# Patient Record
Sex: Female | Born: 1955 | Race: White | Hispanic: No | Smoking: Former smoker
Health system: Southern US, Community
[De-identification: ages and names within clinical notes are randomized; demographics above are authoritative.]

## PROBLEM LIST (undated history)

## (undated) DIAGNOSIS — K219 Gastro-esophageal reflux disease without esophagitis: Secondary | ICD-10-CM

## (undated) DIAGNOSIS — Z8719 Personal history of other diseases of the digestive system: Secondary | ICD-10-CM

## (undated) DIAGNOSIS — I10 Essential (primary) hypertension: Secondary | ICD-10-CM

## (undated) DIAGNOSIS — M1990 Unspecified osteoarthritis, unspecified site: Secondary | ICD-10-CM

## (undated) DIAGNOSIS — C541 Malignant neoplasm of endometrium: Secondary | ICD-10-CM

## (undated) DIAGNOSIS — F419 Anxiety disorder, unspecified: Secondary | ICD-10-CM

## (undated) DIAGNOSIS — Z923 Personal history of irradiation: Secondary | ICD-10-CM

## (undated) HISTORY — DX: Malignant neoplasm of endometrium: C54.1

## (undated) HISTORY — PX: JOINT REPLACEMENT: SHX530

## (undated) HISTORY — PX: ESOPHAGOGASTRODUODENOSCOPY ENDOSCOPY: SHX5814

## (undated) HISTORY — PX: CHALAZION EXCISION: SHX213

## (undated) HISTORY — PX: TONSILLECTOMY: SUR1361

## (undated) HISTORY — PX: CHOLECYSTECTOMY: SHX55

## (undated) HISTORY — PX: HYSTERECTOMY ABDOMINAL WITH SALPINGECTOMY: SHX6725

---

## 1999-12-23 ENCOUNTER — Ambulatory Visit (HOSPITAL_COMMUNITY): Admission: RE | Admit: 1999-12-23 | Discharge: 1999-12-23 | Payer: Self-pay | Admitting: Orthopaedic Surgery

## 1999-12-26 ENCOUNTER — Ambulatory Visit (HOSPITAL_COMMUNITY): Admission: RE | Admit: 1999-12-26 | Discharge: 1999-12-26 | Payer: Self-pay | Admitting: Orthopaedic Surgery

## 2001-09-13 ENCOUNTER — Encounter: Payer: Self-pay | Admitting: Internal Medicine

## 2001-09-13 ENCOUNTER — Ambulatory Visit (HOSPITAL_COMMUNITY): Admission: RE | Admit: 2001-09-13 | Discharge: 2001-09-13 | Payer: Self-pay | Admitting: Internal Medicine

## 2011-03-03 ENCOUNTER — Encounter (HOSPITAL_COMMUNITY)
Admission: RE | Admit: 2011-03-03 | Discharge: 2011-03-03 | Disposition: A | Discharge status: Home / Self Care | Payer: 59 | Source: Ambulatory Visit | Attending: Orthopedic Surgery | Admitting: Orthopedic Surgery

## 2011-03-03 LAB — CBC
HCT: 38 % (ref 36.0–46.0)
Hemoglobin: 13 g/dL (ref 12.0–15.0)
MCH: 31 pg (ref 26.0–34.0)
MCHC: 34.2 g/dL (ref 30.0–36.0)
MCV: 90.7 fL (ref 78.0–100.0)
Platelets: 288 10*3/uL (ref 150–400)
RBC: 4.19 MIL/uL (ref 3.87–5.11)
RDW: 12.4 % (ref 11.5–15.5)
WBC: 10.3 10*3/uL (ref 4.0–10.5)

## 2011-03-03 LAB — SURGICAL PCR SCREEN
MRSA, PCR: NEGATIVE
Staphylococcus aureus: POSITIVE — AB

## 2011-03-03 LAB — PROTIME-INR
INR: 0.9 (ref 0.00–1.49)
Prothrombin Time: 12.3 seconds (ref 11.6–15.2)

## 2011-03-03 LAB — DIFFERENTIAL
Basophils Absolute: 0 10*3/uL (ref 0.0–0.1)
Basophils Relative: 0 % (ref 0–1)
Eosinophils Absolute: 0.2 10*3/uL (ref 0.0–0.7)
Eosinophils Relative: 2 % (ref 0–5)
Lymphocytes Relative: 18 % (ref 12–46)
Lymphs Abs: 1.9 10*3/uL (ref 0.7–4.0)
Monocytes Absolute: 0.7 10*3/uL (ref 0.1–1.0)
Monocytes Relative: 7 % (ref 3–12)
Neutro Abs: 7.6 10*3/uL (ref 1.7–7.7)
Neutrophils Relative %: 73 % (ref 43–77)

## 2011-03-03 LAB — BASIC METABOLIC PANEL
BUN: 21 mg/dL (ref 6–23)
CO2: 28 mEq/L (ref 19–32)
Chloride: 102 mEq/L (ref 96–112)
GFR calc non Af Amer: >60 mL/min (ref >60)
Glucose, Bld: 111 mg/dL — ABNORMAL HIGH (ref 70–99)
Potassium: 4.1 mEq/L (ref 3.5–5.1)

## 2011-03-03 LAB — URINALYSIS, ROUTINE W REFLEX MICROSCOPIC
Hgb urine dipstick: NEGATIVE
Ketones, ur: NEGATIVE mg/dL
Protein, ur: NEGATIVE mg/dL
Urobilinogen, UA: 1 mg/dL (ref 0.0–1.0)

## 2011-03-03 LAB — URINE MICROSCOPIC-ADD ON

## 2011-03-03 LAB — APTT: aPTT: 30 seconds (ref 24–37)

## 2011-03-12 ENCOUNTER — Inpatient Hospital Stay (HOSPITAL_COMMUNITY): Payer: 59

## 2011-03-12 ENCOUNTER — Inpatient Hospital Stay (HOSPITAL_COMMUNITY)
Admission: RE | Admit: 2011-03-12 | Discharge: 2011-03-15 | DRG: 470 | Disposition: A | Discharge status: Home Health | Payer: 59 | Source: Ambulatory Visit | Attending: Orthopedic Surgery | Admitting: Orthopedic Surgery

## 2011-03-12 DIAGNOSIS — D62 Acute posthemorrhagic anemia: Secondary | ICD-10-CM | POA: Diagnosis not present

## 2011-03-12 DIAGNOSIS — K219 Gastro-esophageal reflux disease without esophagitis: Secondary | ICD-10-CM | POA: Diagnosis present

## 2011-03-12 DIAGNOSIS — M1610 Unilateral primary osteoarthritis, unspecified hip: Principal | ICD-10-CM | POA: Diagnosis present

## 2011-03-12 DIAGNOSIS — E669 Obesity, unspecified: Secondary | ICD-10-CM | POA: Diagnosis present

## 2011-03-12 DIAGNOSIS — M169 Osteoarthritis of hip, unspecified: Principal | ICD-10-CM | POA: Diagnosis present

## 2011-03-12 DIAGNOSIS — Z8249 Family history of ischemic heart disease and other diseases of the circulatory system: Secondary | ICD-10-CM

## 2011-03-12 DIAGNOSIS — I10 Essential (primary) hypertension: Secondary | ICD-10-CM | POA: Diagnosis present

## 2011-03-12 DIAGNOSIS — Z01812 Encounter for preprocedural laboratory examination: Secondary | ICD-10-CM

## 2011-03-12 DIAGNOSIS — Z7901 Long term (current) use of anticoagulants: Secondary | ICD-10-CM

## 2011-03-12 DIAGNOSIS — E7800 Pure hypercholesterolemia, unspecified: Secondary | ICD-10-CM | POA: Diagnosis present

## 2011-03-12 LAB — CBC
HCT: 25.6 % — ABNORMAL LOW (ref 36.0–46.0)
Hemoglobin: 8.7 g/dL — ABNORMAL LOW (ref 12.0–15.0)
MCH: 30.9 pg (ref 26.0–34.0)
MCV: 90.8 fL (ref 78.0–100.0)
RBC: 2.82 MIL/uL — ABNORMAL LOW (ref 3.87–5.11)
WBC: 10.3 10*3/uL (ref 4.0–10.5)

## 2011-03-12 LAB — TYPE AND SCREEN
ABO/RH(D): O POS
Antibody Screen: NEGATIVE

## 2011-03-12 LAB — ABO/RH: ABO/RH(D): O POS

## 2011-03-12 IMAGING — CR DG PORTABLE PELVIS
1 series · 1 of 1 positions shown · non-contrast
Comparison: None

CLINICAL DATA: Right hip arthroplasty.

PORTABLE PELVIS

[view not recorded]
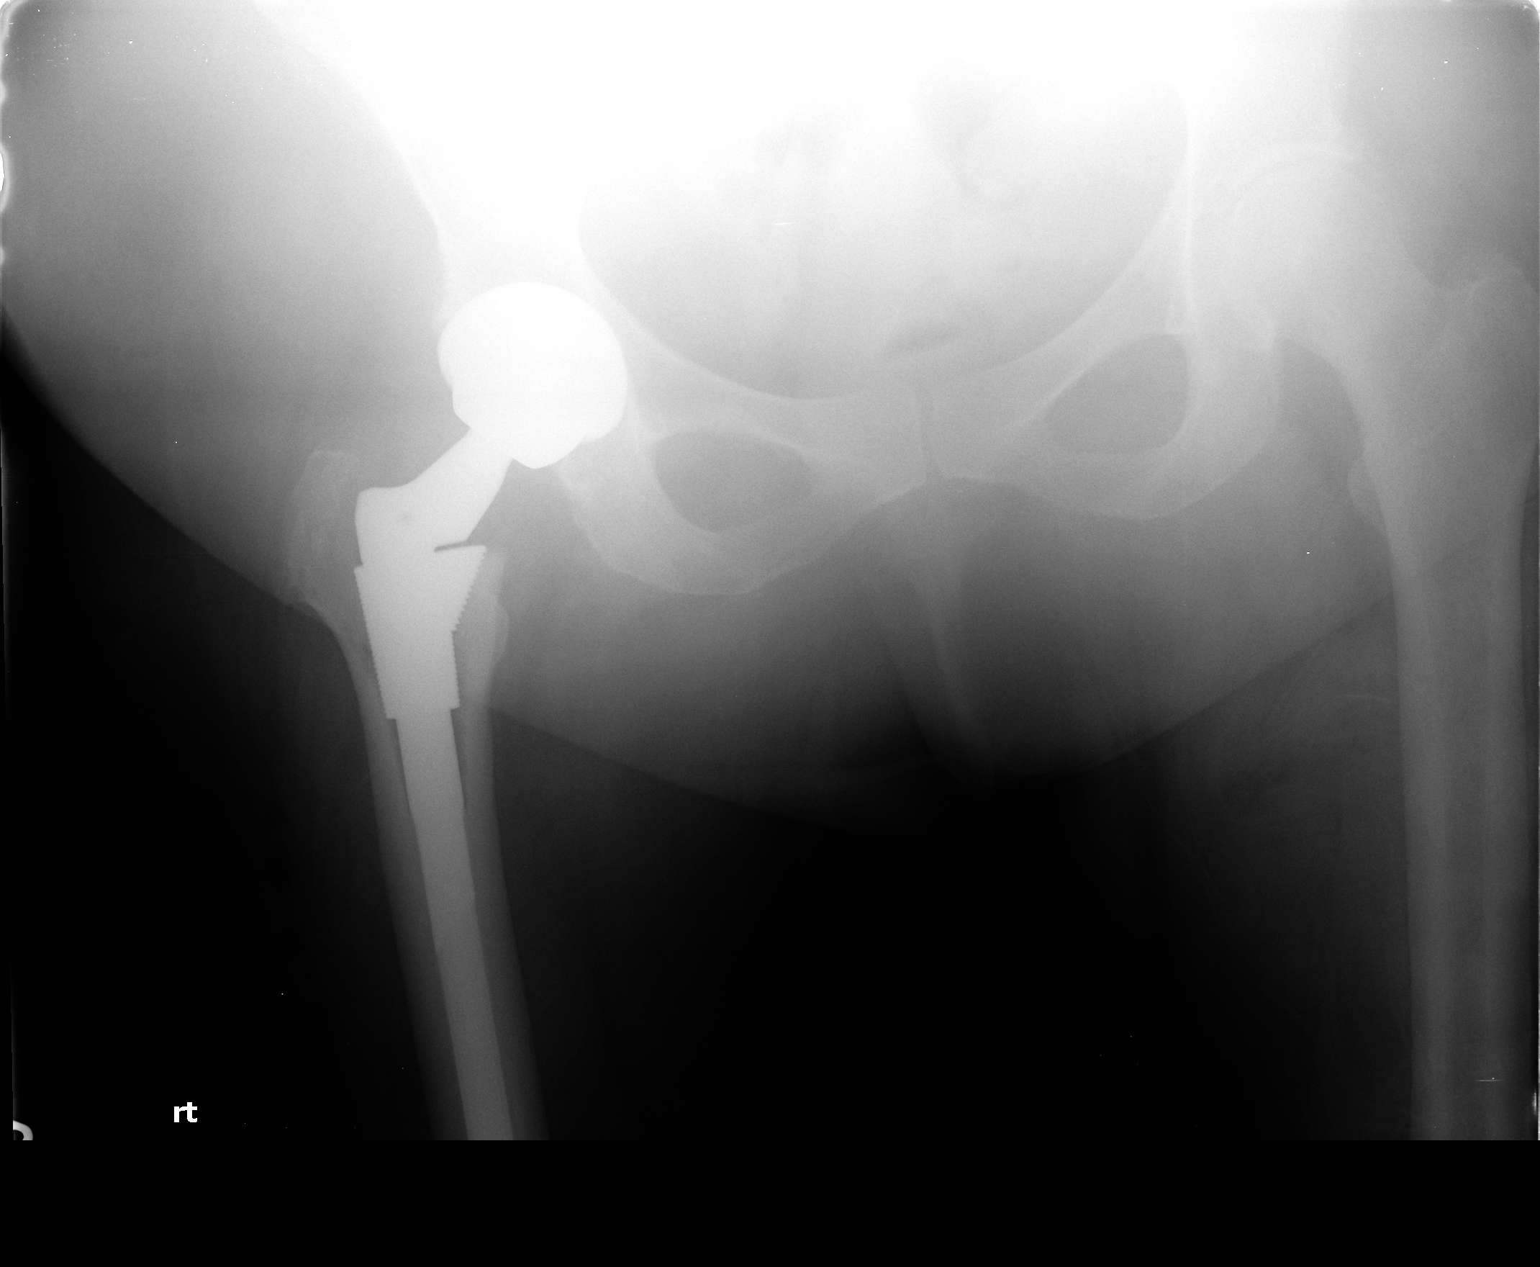

[1 of 1 positions shown; findings below may reference images not displayed]

FINDINGS: Hardware components of the right hip arthroplasty device
are noted. Alignment appears anatomic.  No periprosthetic fracture
or dislocation.
IMPRESSION: 1.  No complications status post right hip arthroplasty.

## 2011-03-13 LAB — CBC
HCT: 24.3 % — ABNORMAL LOW (ref 36.0–46.0)
Hemoglobin: 8.3 g/dL — ABNORMAL LOW (ref 12.0–15.0)
MCHC: 34.2 g/dL (ref 30.0–36.0)
MCV: 90.3 fL (ref 78.0–100.0)
RDW: 12.5 % (ref 11.5–15.5)

## 2011-03-13 LAB — BASIC METABOLIC PANEL
CO2: 32 mEq/L (ref 19–32)
Calcium: 8.5 mg/dL (ref 8.4–10.5)
Chloride: 100 mEq/L (ref 96–112)
Creatinine, Ser: <0.47 mg/dL — ABNORMAL LOW (ref 0.50–1.10)
Glucose, Bld: 140 mg/dL — ABNORMAL HIGH (ref 70–99)

## 2011-03-14 LAB — CBC
HCT: 21.3 % — ABNORMAL LOW (ref 36.0–46.0)
MCH: 30.9 pg (ref 26.0–34.0)
MCV: 90.3 fL (ref 78.0–100.0)
Platelets: 183 10*3/uL (ref 150–400)
RBC: 2.36 MIL/uL — ABNORMAL LOW (ref 3.87–5.11)
RDW: 12.5 % (ref 11.5–15.5)
WBC: 8.8 10*3/uL (ref 4.0–10.5)

## 2011-03-15 LAB — CBC
HCT: 20.1 % — ABNORMAL LOW (ref 36.0–46.0)
MCH: 31.2 pg (ref 26.0–34.0)
MCV: 91 fL (ref 78.0–100.0)
Platelets: 206 10*3/uL (ref 150–400)
RBC: 2.21 MIL/uL — ABNORMAL LOW (ref 3.87–5.11)
WBC: 9.4 10*3/uL (ref 4.0–10.5)

## 2011-03-15 NOTE — Discharge Summary (Signed)
  [name], [name] NO.:  [id number]  MEDICAL RECORD NO.:  [id number]  LOCATION:  5015                         FACILITY:  [facility]  PHYSICIAN:  Feliberto Gottron. Turner Daniels, M.D.   DATE OF BIRTH:  12/29/1955  DATE OF ADMISSION:  03/12/2011 DATE OF DISCHARGE:  03/15/2011                              DISCHARGE SUMMARY   CHIEF COMPLAINTS:  Right hip pain.  HISTORY OF PRESENT ILLNESS:  This is a 55 year old lady who complains of severe unremitting pain in her right hip despite extensive conservative treatment.  She desires a surgical intervention.  All risks and benefits of surgery were discussed with the patient.  PAST MEDICAL HISTORY:  Significant for: 1. High cholesterol. 2. Hypertension. 3. Obesity. 4. Arthritis.  PAST SURGICAL HISTORY:  Significant for cholecystectomy and tonsillectomy.  She is an allergy to [facility].  SOCIAL HISTORY:  She denies use of tobacco and drinks occasional alcohol.  FAMILY HISTORY:  Positive for hypertension and arthritis.  PHYSICAL EXAMINATION:  Gross examination of the right hip demonstrates significant pain with internal rotation.  She has a negative foot tap and is neurovascularly intact.  X-rays of the right hip demonstrate bone-on-bone degenerative joint disease with lateral subluxation of the femoral head.  PREOP LABORATORY DATA:  White blood cells 10.3, red blood cells 4.19, hemoglobin 13, hematocrit 38, and platelets 188.  PT 12.3, INR 0.9, PTT 30.  Sodium 143, potassium 4.1, chloride 102, glucose 111, BUN 21, and creatinine 0.77.  Urinalysis was within normal limits.  HOSPITAL COURSE:  Mr. Dake was admitted to [facility] on March 12, 2011, when she underwent a right total hip arthroplasty.  The procedure was performed by Dr. Gean Birchwood and the patient tolerated it well.  A perioperative Foley catheter was placed.  She was transferred to the floor on Lovenox and Coumadin for DVT prophylaxis.  On the  first postoperative day, she reported moderate pain in her hip.  She had nausea, but no vomiting.  Her dressing was clean.  Foley catheter was removed after physical therapy.  She was able to ambulate 50 feet on postoperative days #2.  Hemoglobin was 7.3, but she denied any dizziness, shortness of breath, or chest pain.  Her dressing remained clean and she was progressing well with physical therapy.  On postoperative day #3, her hemoglobin was 6.9, but she continued to deny any symptoms of anemia.  She passed physical therapy and was discharged home.  DISPOSITION:  The patient was discharged home on March 15, 2011. She was weightbearing as tolerated and would return to the clinic in 2 weeks for x-rays and staple removal.  Home Health Care would manage her wound, Coumadin, and physical therapy.  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     Shirl Harris, PA   ______________________________ Feliberto Gottron. Turner Daniels, M.D.    JW/MEDQ  D:  03/15/2011  T:  03/15/2011  Job:  [id number]  Electronically Signed by Shirl Harris PA on 03/15/2011 12:40:21 PM Electronically Signed by Gean Birchwood M.D. on 03/15/2011 07:33:07 PM

## 2011-03-15 NOTE — Op Note (Signed)
NAMEMARIENE, DICKERMAN NO.:  [id number]  MEDICAL RECORD NO.:  [id number]  LOCATION:  2550                         FACILITY:  [facility]  PHYSICIAN:  Feliberto Gottron. Turner Daniels, M.D.   DATE OF BIRTH:  1956-03-22  DATE OF PROCEDURE:  03/12/2011 DATE OF DISCHARGE:                              OPERATIVE REPORT   PREOPERATIVE DIAGNOSIS:  End-stage arthritis, right hip with lateral subluxation of the femoral head.  POSTOPERATIVE DIAGNOSIS:  End-stage arthritis, right hip with lateral subluxation of the femoral head.  PROCEDURE:  Right total hip arthroplasty using a DePuy 50-mm pinnacle cup central occluder, metal liner, +3 36-mm metal head, 18 x 13 x 160 x 42 SROM stem, 18D small cone, +3 36-mm metal head.  SURGEON:  Feliberto Gottron. Turner Daniels, MD  FIRST ASSISTANT:  Shirl Harris PA-C  ANESTHETIC:  General endotracheal.  ESTIMATED BLOOD LOSS:  900 mL.  FLUID REPLACEMENT:  2 liters of crystalloid and 1 unit of Hespan.  URINE OUTPUT:  300 mL.  DRAINS:  Foley catheter.  INDICATIONS FOR PROCEDURE:  The patient is a 55 year old woman with end- stage arthritis of the right hip, bone-on-bone lateral subluxation of the femoral head is actually levering on the lateral aspect of the acetabular rim and there is a partial collapse of the femoral head.  She has severe unremitting pain with sitting, standing or walking, and has rest pain as well, cannot roll on the hip.  Failed conservative measures, anti-inflammatory medicines, exercises, judicious use of narcotics and overall, she is miserable.  She has been offered a walking assistive devices, but that does not help either.  She desires elective right total hip arthroplasty to decrease pain and increase function. She is relatively young in age and her BMI is around 42.  She desires elective total hip arthroplasty to decrease pain and increase function. Risks and benefits of surgery have been discussed, questions  were answered.  DESCRIPTION OF PROCEDURE:  The patient was identified by armband and was taken to the Holding Area at [facility], received preoperative IV antibiotics, taken to operating room #1, appropriate anesthetic monitors were attached, endotracheal anesthesia induced with the patient in supine position.  Foley catheter inserted.  She was rolled on the left lateral decubitus position and fixed there with a Stulberg Mark II pelvic clamp.  Axillary pad placed.  She was then prepped and draped in sterile fashion from the ankle to the hemipelvis.  Time-out procedure performed.  Skin along the lateral hip and thigh infiltrated with 20 mL of 0.5% Marcaine and epinephrine solution.  We then began the operation itself by making a 20-cm incision centered over the greater trochanter, allowing posterolateral approach to the hip joint.  Small bleeders in the skin and subcutaneous tissue identified and cauterized.  IT band cut in line with skin incision exposing the greater trochanter.  Cobra retractor was placed between the gluteus minimus in the superior hip joint capsule and quadratus femoris in the inferior hip joint capsule. This isolated the piriformis and short external rotators, which were tagged with a #2 Ethibond suture and cut off their insertion on the intertrochanteric crest.  This exposed the hip joint capsule,  which was developed into an acetabular based flap going from posterior-superior off the acetabulum out over the femoral neck and exiting posterior- inferior to the acetabular rim.  This flap was likewise tagged with two #2 Ethibond sutures.  The hip was flexed and internally rotated dislocating the femoral head and a standard neck cut was performed one fingerbreadth above the lesser trochanter.  Femoral head was noted to be collapsed with bare bone.  The proximal femur was then translated anteriorly levering off the anterior column with Hohmann retractor.  A Spike  Cobra was placed in the cotyloid notch and a posterior-inferior wing retractor was placed at the junction of the acetabulum and ischium exposing the labrum, which was then removed.  We then sequentially reamed up to a 49-mm basket reamer obtaining good coverage in all quadrants and hammered into place a 50-mm DePuy pinnacle shell in 48 degrees of abduction and about 20 degrees of anteversion.  Central occluder was then placed followed by a 36-mm metal liner.  It was flexed internally rotated exposing the proximal femur, which was entered with a box-cutting chisel followed by the initiating reamer and actually reaming to full depth with 8, 10, 11, and 12-mm reamers.  We got good chatter.  We then reamed an 0.5-mm increments up to 13.5 and partial depth at 14.  Conical reaming was then accomplished with the 18 Bravo and Delta cones.  The Delta had the best fit and filled for 42 base neck and we reamed the calcar to a small calcar.  A trial 18 Delta small cone was then placed followed by trial stem with a 42 base neck and 0 in +3 36-mm trial heads stability to 90 of flexion and 75 of internal rotation was noted with a +3 in an extension and 30 of external rotation.  The hip could not be dislocated.  Trial components were removed.  Wound irrigated out with normal saline solution.  We then hammered into place an 18D small ZTT1 cone followed by reaming began with a 13.5 reamer.  We then hammered into place an 18 x 13 x 160 x 42 stem in the same version as a calcar.  On top of this, we hammered into a +3 36-mm metal head and reduced the hip stability was once again noted to be excellent.  We then irrigated with normal saline solution one more time, repaired the capsular flap and short external rotator was back to the intertrochanteric crest through drill holes.  We closed the IT band over the greater trochanter with running #1 Vicryl suture.  The subcutaneous tissue with 0 and 2-0 undyed Vicryl  suture and the skin with skin staples.  A dressing of Xeroform and Mepilex was applied.  The patient was unclamped, rolled supine, awakened, extubated, and taken to the recovery room without difficulty.     Feliberto Gottron. Turner Daniels, M.D.     Ovid Curd  D:  03/12/2011  T:  03/12/2011  Job:  [id number]  Electronically Signed by Gean Birchwood M.D. on 03/15/2011 06:28:00 AM

## 2015-01-10 ENCOUNTER — Other Ambulatory Visit: Payer: Self-pay | Admitting: Gastroenterology

## 2015-01-14 ENCOUNTER — Encounter (HOSPITAL_COMMUNITY): Payer: Self-pay | Admitting: *Deleted

## 2015-01-21 ENCOUNTER — Ambulatory Visit (HOSPITAL_COMMUNITY): Payer: 59 | Admitting: Anesthesiology

## 2015-01-21 ENCOUNTER — Encounter (HOSPITAL_COMMUNITY)
Admission: RE | Disposition: A | Discharge status: Home / Self Care | Payer: Self-pay | Source: Ambulatory Visit | Attending: Gastroenterology

## 2015-01-21 ENCOUNTER — Encounter (HOSPITAL_COMMUNITY): Payer: Self-pay | Admitting: *Deleted

## 2015-01-21 ENCOUNTER — Ambulatory Visit (HOSPITAL_COMMUNITY)
Admission: RE | Admit: 2015-01-21 | Discharge: 2015-01-21 | Disposition: A | Discharge status: Home / Self Care | Payer: 59 | Source: Ambulatory Visit | Attending: Gastroenterology | Admitting: Gastroenterology

## 2015-01-21 DIAGNOSIS — Z6841 Body Mass Index (BMI) 40.0 and over, adult: Secondary | ICD-10-CM | POA: Diagnosis not present

## 2015-01-21 DIAGNOSIS — Z791 Long term (current) use of non-steroidal anti-inflammatories (NSAID): Secondary | ICD-10-CM | POA: Insufficient documentation

## 2015-01-21 DIAGNOSIS — K589 Irritable bowel syndrome without diarrhea: Secondary | ICD-10-CM | POA: Diagnosis not present

## 2015-01-21 DIAGNOSIS — D124 Benign neoplasm of descending colon: Secondary | ICD-10-CM | POA: Insufficient documentation

## 2015-01-21 DIAGNOSIS — M1990 Unspecified osteoarthritis, unspecified site: Secondary | ICD-10-CM | POA: Diagnosis not present

## 2015-01-21 DIAGNOSIS — Z87891 Personal history of nicotine dependence: Secondary | ICD-10-CM | POA: Insufficient documentation

## 2015-01-21 DIAGNOSIS — Z1211 Encounter for screening for malignant neoplasm of colon: Secondary | ICD-10-CM | POA: Diagnosis present

## 2015-01-21 DIAGNOSIS — I10 Essential (primary) hypertension: Secondary | ICD-10-CM | POA: Insufficient documentation

## 2015-01-21 DIAGNOSIS — Z7982 Long term (current) use of aspirin: Secondary | ICD-10-CM | POA: Diagnosis not present

## 2015-01-21 DIAGNOSIS — Z9049 Acquired absence of other specified parts of digestive tract: Secondary | ICD-10-CM | POA: Diagnosis not present

## 2015-01-21 DIAGNOSIS — K219 Gastro-esophageal reflux disease without esophagitis: Secondary | ICD-10-CM | POA: Insufficient documentation

## 2015-01-21 DIAGNOSIS — F419 Anxiety disorder, unspecified: Secondary | ICD-10-CM | POA: Diagnosis not present

## 2015-01-21 DIAGNOSIS — Z800 Family history of malignant neoplasm of digestive organs: Secondary | ICD-10-CM | POA: Insufficient documentation

## 2015-01-21 DIAGNOSIS — Z96642 Presence of left artificial hip joint: Secondary | ICD-10-CM | POA: Insufficient documentation

## 2015-01-21 DIAGNOSIS — Z79899 Other long term (current) drug therapy: Secondary | ICD-10-CM | POA: Diagnosis not present

## 2015-01-21 HISTORY — DX: Essential (primary) hypertension: I10

## 2015-01-21 HISTORY — DX: Gastro-esophageal reflux disease without esophagitis: K21.9

## 2015-01-21 HISTORY — DX: Unspecified osteoarthritis, unspecified site: M19.90

## 2015-01-21 HISTORY — DX: Anxiety disorder, unspecified: F41.9

## 2015-01-21 HISTORY — PX: COLONOSCOPY WITH PROPOFOL: SHX5780

## 2015-01-21 HISTORY — DX: Personal history of other diseases of the digestive system: Z87.19

## 2015-01-21 SURGERY — COLONOSCOPY WITH PROPOFOL
Anesthesia: Monitor Anesthesia Care

## 2015-01-21 MED ORDER — LACTATED RINGERS IV SOLN
INTRAVENOUS | Status: DC
  Administered 2015-01-21: 1000 mL via INTRAVENOUS

## 2015-01-21 MED ORDER — PROPOFOL 10 MG/ML IV BOLUS
INTRAVENOUS | Status: AC
Start: 2015-01-21 — End: 2015-01-21
  Filled 2015-01-21: qty 20

## 2015-01-21 MED ORDER — PROPOFOL 10 MG/ML IV BOLUS
INTRAVENOUS | Status: AC
  Filled 2015-01-21: qty 20

## 2015-01-21 MED ORDER — SODIUM CHLORIDE 0.9 % IV SOLN: INTRAVENOUS | Status: DC

## 2015-01-21 MED ORDER — PROPOFOL 10 MG/ML IV BOLUS
INTRAVENOUS | Status: DC | PRN
  Administered 2015-01-21: 10 mg via INTRAVENOUS
  Administered 2015-01-21 (×2): 20 mg via INTRAVENOUS
  Administered 2015-01-21: 40 mg via INTRAVENOUS
  Administered 2015-01-21: 30 mg via INTRAVENOUS
  Administered 2015-01-21 (×5): 20 mg via INTRAVENOUS
  Administered 2015-01-21: 10 mg via INTRAVENOUS
  Administered 2015-01-21 (×2): 20 mg via INTRAVENOUS
  Administered 2015-01-21: 50 mg via INTRAVENOUS

## 2015-01-21 SURGICAL SUPPLY — 22 items

## 2015-01-21 NOTE — H&P (Signed)
BARRY CULVERHOUSE is an 59 y.o. female.    Chief Complaint: Colorectal cancer screening.  HPI: 59 year old, morbidly obese, white female, here for colorectal cancer screening. See office notes for details.  Past Medical History  Diagnosis Date  . Hypertension   . Anxiety   . H/O irritable bowel syndrome     past history after gallbladder  . GERD (gastroesophageal reflux disease)     occ. OTC meds used  . Arthritis     osteoarthritis   Past Surgical History  Procedure Laterality Date  . Cholecystectomy      laparoscopic  . Joint replacement      [id number]  . Tonsillectomy      child  . Chalazion excision    . Esophagogastroduodenoscopy endoscopy     History reviewed. No pertinent family history. Social History:  reports that she quit smoking about 31 years ago. She does not have any smokeless tobacco history on file. She reports that she drinks alcohol. She reports that she does not use illicit drugs.  Allergies: No Known Allergies  Medications Prior to Admission  Medication Sig Dispense Refill  . ALPRAZolam (XANAX) 0.5 MG tablet take 1 tablet by mouth twice a day if needed FOR ANXIETY  0  . amLODipine-benazepril (LOTREL) 5-10 MG per capsule Take 1 capsule by mouth every morning.    Marland Kitchen aspirin EC 81 MG tablet Take 81 mg by mouth daily.    . Calcium Carbonate-Vitamin D (CALCIUM + D PO) Take 1 tablet by mouth daily.    . chlorthalidone (HYGROTON) 25 MG tablet Take 25 mg by mouth every morning.    Marland Kitchen glucosamine-chondroitin 500-400 MG tablet Take 1 tablet by mouth daily.    Marland Kitchen lovastatin (MEVACOR) 40 MG tablet Take 40 mg by mouth every morning.    . Magnesium 250 MG TABS Take 1 tablet by mouth daily.    . meloxicam (MOBIC) 15 MG tablet Take 15 mg by mouth daily.    . Multiple Vitamin (MULTIVITAMIN WITH MINERALS) TABS tablet Take 1 tablet by mouth daily.    Marland Kitchen omeprazole (PRILOSEC OTC) 20 MG tablet Take 20 mg by mouth daily.    . potassium chloride (K-DUR,KLOR-CON) 10 MEQ tablet Take  10 mEq by mouth 2 (two) times daily.    . sertraline (ZOLOFT) 50 MG tablet Take 50 mg by mouth every morning.    . tolterodine (DETROL LA) 4 MG 24 hr capsule Take 4 mg by mouth daily.      No results found for this or any previous visit (from the past 48 hour(s)). No results found.  Review of Systems  Constitutional: Negative.   HENT: Negative.   Eyes: Negative.   Respiratory: Negative.   Cardiovascular: Negative.   Gastrointestinal: Positive for heartburn.  Musculoskeletal: Positive for joint pain.  Endo/Heme/Allergies: Negative.   Psychiatric/Behavioral: Negative.    Blood pressure 158/91, temperature 98.3 F (36.8 C), temperature source Oral, resp. rate 14, height 5\' 5"  (1.651 m), weight 147.419 kg (325 lb), SpO2 96 %. Physical Exam   Assessment/Plan Colorectal cancer screening: Proceed with a colonoscopy at this time.   Lizza Huffaker 01/21/2015, 7:18 AM

## 2015-01-21 NOTE — Discharge Instructions (Signed)
Colonoscopy, Care After °These instructions give you information on caring for yourself after your procedure. Your doctor may also give you more specific instructions. Call your doctor if you have any problems or questions after your procedure. °HOME CARE °· Do not drive for 24 hours. °· Do not sign important papers or use machinery for 24 hours. °· You may shower. °· You may go back to your usual activities, but go slower for the first 24 hours. °· Take rest breaks often during the first 24 hours. °· Walk around or use warm packs on your belly (abdomen) if you have belly cramping or gas. °· Drink enough fluids to keep your pee (urine) clear or pale yellow. °· Resume your normal diet. Avoid heavy or fried foods. °· Avoid drinking alcohol for 24 hours or as told by your doctor. °· Only take medicines as told by your doctor. °If a tissue sample (biopsy) was taken during the procedure:  °· Do not take aspirin or blood thinners for 7 days, or as told by your doctor. °· Do not drink alcohol for 7 days, or as told by your doctor. °· Eat soft foods for the first 24 hours. °GET HELP IF: °You still have a small amount of blood in your poop (stool) 2-3 days after the procedure. °GET HELP RIGHT AWAY IF: °· You have more than a small amount of blood in your poop. °· You see clumps of tissue (blood clots) in your poop. °· Your belly is puffy (swollen). °· You feel sick to your stomach (nauseous) or throw up (vomit). °· You have a fever. °· You have belly pain that gets worse and medicine does not help. °MAKE SURE YOU: °· Understand these instructions. °· Will watch your condition. °· Will get help right away if you are not doing well or get worse. °Document Released: 07/24/2010 Document Revised: 06/26/2013 Document Reviewed: 02/26/2013 °ExitCare® Patient Information ©2015 [employer]. This information is not intended to replace advice given to you by your health care provider. Make sure you discuss any questions you have with  your health care provider. ° °

## 2015-01-21 NOTE — Anesthesia Preprocedure Evaluation (Addendum)
Anesthesia Evaluation  Patient identified by MRN, date of birth, ID band Patient awake    Reviewed: Allergy & Precautions, Patient's Chart, lab work & pertinent test results  Airway Mallampati: I  TM Distance: >3 FB Neck ROM: Full    Dental  (+) Teeth Intact   Pulmonary former smoker,  breath sounds clear to auscultation        Cardiovascular hypertension, Rhythm:Regular Rate:Normal     Neuro/Psych    GI/Hepatic Neg liver ROS, GERD-  ,  Endo/Other  negative endocrine ROS  Renal/GU negative Renal ROS     Musculoskeletal  (+) Arthritis -,   Abdominal (+) + obese,   Peds  Hematology negative hematology ROS (+)   Anesthesia Other Findings   Reproductive/Obstetrics                            Anesthesia Physical Anesthesia Plan  ASA: III  Anesthesia Plan: MAC   Post-op Pain Management:    Induction: Intravenous  Airway Management Planned: Natural Airway  Additional Equipment:   Intra-op Plan:   Post-operative Plan:   Informed Consent: I have reviewed the patients History and Physical, chart, labs and discussed the procedure including the risks, benefits and alternatives for the proposed anesthesia with the patient or authorized representative who has indicated his/her understanding and acceptance.   Dental advisory given  Plan Discussed with: CRNA and Surgeon  Anesthesia Plan Comments:        Anesthesia Quick Evaluation

## 2015-01-21 NOTE — Transfer of Care (Signed)
Immediate Anesthesia Transfer of Care Note  Patient: [name]  Procedure(s) Performed: Procedure(s): COLONOSCOPY WITH PROPOFOL (N/A)  Patient Location: PACU  Anesthesia Type:MAC  Level of Consciousness: sedated  Airway & Oxygen Therapy: Patient Spontanous Breathing and Patient connected to nasal cannula oxygen  Post-op Assessment: Report given to RN and Post -op Vital signs reviewed and stable  Post vital signs: Reviewed and stable  Last Vitals:  Filed Vitals:   01/21/15 0654  BP: 158/91  Temp: 36.8 C  Resp: 14    Complications: No apparent anesthesia complications

## 2015-01-21 NOTE — Op Note (Signed)
[facility] [place], [address]   OPERATIVE PROCEDURE REPORT  PATIENT: [name], [name]  MR#: [id number] BIRTHDATE: 10-13-1955 GENDER: female ENDOSCOPIST: Edmonia James, MD ASSISTANT:   Elspeth Cho, technician & Cleda Daub, RN. PROCEDURE DATE: 02-02-2015 PRE-PROCEDURE PREPARATION: Patient fasted for 4 hours prior to procedure. The patient was prepped with a gallon of Golytely the night prior to the procedure.  The patient has fasted for 4 hours prior to the procedure PRE-PROCEDURE PHYSICAL: Patient has stable vital signs.  Neck is supple.  There is no JVD, thyromegaly or LAD.  Chest clear to auscultation.  S1 and S2 regular.  Abdomen soft, morbidly obese, non-distended, non-tender with NABS. PROCEDURE:     Colonoscopy with cold biopsies x 2 ASA CLASS:     Class II INDICATIONS:     1.  CRC screening 2. Family history of colon cancer-grandmotehr. MEDICATIONS:     Monitored anesthesia care  DESCRIPTION OF PROCEDURE: After the risks, benefits, and alternatives of the procedure were thoroughly explained [including a 10% missed rate of cancer and polyps], informed consent was obtained. Digital rectal exam was performed.  The EC-[id number] ([id number])  was introduced through the anus  and advanced to the cecum, which was identified by both the appendix and ileocecal valve , limited by No adverse events experienced.  The quality of the prep was good. . Multiple washes were done. Small lesions could be missed. The instrument was then slowly withdrawn as the colon was fully examined. Estimated blood loss is zero unless otherwise noted in this procedure report.     COLON FINDINGS: A single dimunitive polyp was found in the proximal descending colon. This was removed by cold biopsies x 2 using cold forceps. The entire colonic mucosa appeared healthy with a normal vascular pattern. No masses, diverticula or AVMs were noted. The appendiceal orifice and the  ICV were identified and photographed. The terminal ileum appeared normal.  Retroflexed views revealed no abnormalities. The patient tolerated the procedure without immediate complications.  The scope was then withdrawn from the patient and the procedure terminated.  TIME TO CECUM:     2 minutes 00 seconds WITHDRAW TIME:  10 minutes 00 seconds  IMPRESSION:     One dimunitive polyp was found in the proximal descending colon-removed by cold biopsies x 2; otherwise, normal colonoscopy upto the cecum.  RECOMMENDATIONS:     1.  Hold Aspirin and all other NSAIDS for 2 weeks. 2.  Await pathology results. 3.  Continue current medications. 4.  High fiber diet with liberal fluid intake. 5.  OP follow-up is advised on a PRN basis.  REPEAT EXAM:      In 5 years  for a repeat colonoscopy.  If the patient has any abnormal GI symptoms in the interim, she have been advised to contact the office as soon as possible for further recommendations.   REFERRED BY: Glendon Axe, M.D. eSigned:  Edmonia James, MD 02-Feb-2015 7:58 AM  CPT CODES:     [id number] Colonoscopy, flexible, proximal to splenic flexure; with biopsy, single or multiple ICD CODES:     Z12.11 Encounter for screening for malignant neoplasm of colon Z80.0 Family history of malignant neoplasm of digestive organs D12.4 Benign neoplasm of descending colon  The ICD and CPT codes recommended by this software are interpretations from the data that the clinical staff has captured with the software.  The verification of the translation of this report to the ICD and CPT codes and  modifiers is the sole responsibility of the health care institution and practicing physician where this report was generated.  [employer] will not be held responsible for the validity of the ICD and CPT codes included on this report.  AMA assumes no liability for data contained or not contained herein. CPT is a Designer, television/film set of the [employer].  PATIENT NAME:  Danila, Eddie MR#: [id number]

## 2015-01-21 NOTE — Anesthesia Postprocedure Evaluation (Signed)
  Anesthesia Post-op Note  Patient: [name]  Procedure(s) Performed: Procedure(s): COLONOSCOPY WITH PROPOFOL (N/A)  Patient Location: Endoscopy Unit  Anesthesia Type:MAC  Level of Consciousness: awake and alert   Airway and Oxygen Therapy: Patient Spontanous Breathing  Post-op Pain: none  Post-op Assessment: Post-op Vital signs reviewed              Post-op Vital Signs: stable  Last Vitals:  Filed Vitals:   01/21/15 0800  BP: 121/81  Pulse:   Temp:   Resp:     Complications: No apparent anesthesia complications

## 2015-01-23 ENCOUNTER — Encounter (HOSPITAL_COMMUNITY): Payer: Self-pay | Admitting: Gastroenterology

## 2019-11-01 ENCOUNTER — Other Ambulatory Visit: Payer: Self-pay | Admitting: Family Medicine

## 2019-11-05 ENCOUNTER — Other Ambulatory Visit: Payer: Self-pay | Admitting: Family Medicine

## 2019-11-05 DIAGNOSIS — M25511 Pain in right shoulder: Secondary | ICD-10-CM

## 2019-11-24 ENCOUNTER — Ambulatory Visit
Admission: RE | Admit: 2019-11-24 | Discharge: 2019-11-24 | Disposition: A | Discharge status: Home / Self Care | Payer: 59 | Source: Ambulatory Visit | Attending: Family Medicine | Admitting: Family Medicine

## 2019-11-24 ENCOUNTER — Other Ambulatory Visit: Payer: Self-pay

## 2019-11-24 DIAGNOSIS — M25511 Pain in right shoulder: Secondary | ICD-10-CM

## 2020-03-20 ENCOUNTER — Ambulatory Visit (INDEPENDENT_AMBULATORY_CARE_PROVIDER_SITE_OTHER): Payer: Self-pay | Admitting: Family Medicine

## 2020-04-03 ENCOUNTER — Ambulatory Visit (INDEPENDENT_AMBULATORY_CARE_PROVIDER_SITE_OTHER): Payer: Self-pay | Admitting: Family Medicine

## 2020-11-25 LAB — PULMONARY FUNCTION TEST

## 2021-01-09 ENCOUNTER — Telehealth: Payer: Self-pay | Admitting: Radiation Oncology

## 2021-01-14 NOTE — Progress Notes (Signed)
Radiation Oncology         [phone] ________________________________  Initial Outpatient Consultation  Name: [name] MRN: [id number]  Date: 01/15/2021  DOB: 04/29/1956  CC:Smith, Karla, MD  Skinner, Elizabeth N, MD   REFERRING PHYSICIAN: Skinner, Elizabeth N, MD  DIAGNOSIS: The encounter diagnosis was Endometrial cancer (HCC).  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 21-day paclitaxel and carboplatin  HISTORY OF PRESENT ILLNESS::[name] is a 64 y.o. female who is accompanied by no one.  She is a recent widow. she is seen as a courtesy of Dr. Skinner for an opinion concerning radiation therapy as part of management for her recently diagnosed endometrial cancer. The patient presented to [facility] on 07/31/20 for robotic hysterectomy with bilateral salpingo-oophorectomy. Due to the patient's medical comorbidities, complete staging was not performed. Final pathology revealed a stage IIIa grade 3 endometrioid adenocarcinoma of the uterus. ER and PR negative. No metastatic disease was seen otherwise.  She initially presented with postmenopausal vaginal bleeding.  She was initially seen by Dr. Cousins and endometrial biopsy was abnormal.  The patient was referred to Dr. Skinner at that time.  On January 27 patient underwent an uncomplicated robotic hysterectomy bilateral salpingo-oophorectomy and right sentinel node mapping and biopsy.  No nodal tissue was noted on final pathology.  The left sentinel lymph node mapping did not occur.  Due to the patient's morbidity obesity complete surgical staging was not performed.  Pathologic findings were significant for any endometrioid carcinoma FIGO grade 3.  There was deep myometrial invasion throughout the thickness of the myometrium and focal involvement of the uterine serosa.  There was no evidence of lymphovascular space invasion..  The lesion was located in the right side of the fundus and  involved the anterior and posterior walls.  Measured approximately 3.0 cm x 2.8 cm.  Lesion extended to the outer half of the myometrium at the fundus and focally extended beyond the serosa contiguous with the right fundal adhesions.  She received cycle one of adjuvant treatment with paclitaxel and carboplatin on 09/03/20 and completed treatment on 12/18/20.  She completed 6 cycles of paclitaxel and carboplatinum with completion of chemotherapy on December 18, 2020.  CT of the chest abdomen and pelvis taken on 09/15/20 showed no evidence of metastatic disease. Findings otherwise showed bilateral renal cortical cysts and hepatic stenosis.  CT of the abdomen and pelvis was performed on 12/31/20 to assess adjuvant treatment response. Findings showed no significant change from imaging on 09/15/20. However, chest CT taken on the same day showed a 2.3 x 2.5 cm rounded lesion in the right breast.   Of note: the patient received an MRI of her right shoulder on 11/23/20 which showed a large full thickness rotator cuff tear, moderately retracted insertional tears of the supraspinatus and infraspinatus tendons, as well as some surrounding muscle atrophy.  Follow-up mammogram performed on 01/06/21 showed no suspicious masses or calcifications.   The patient met with Dr. Skinner on 01/08/21 during which time the patient reported doing well with her last round of chemotherapy. 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 She additionally reports some radiating pain down into her right arm and leg. It was during this visit that Dr. Skinner referred the patient to radiation oncology for consultation.    PREVIOUS RADIATION THERAPY: No  PAST   MEDICAL HISTORY:  Past Medical History:  Diagnosis Date   Anxiety    Arthritis    osteoarthritis   GERD (gastroesophageal reflux disease)    occ. OTC meds used   H/O irritable  bowel syndrome    past history after gallbladder   Hypertension     PAST SURGICAL HISTORY: Past Surgical History:  Procedure Laterality Date   CHALAZION EXCISION     CHOLECYSTECTOMY     laparoscopic   COLONOSCOPY WITH PROPOFOL N/A 01/21/2015   Procedure: COLONOSCOPY WITH PROPOFOL;  Surgeon: Jyothi Mann, MD;  Location: [facility];  Service: Endoscopy;  Laterality: N/A;   ESOPHAGOGASTRODUODENOSCOPY ENDOSCOPY     JOINT REPLACEMENT     LTHA'12   TONSILLECTOMY     child    FAMILY HISTORY: History reviewed. No pertinent family history.  SOCIAL HISTORY:  Social History   Tobacco Use   Smoking status: Former    Packs/day: 0.25    Years: 2.00    Pack years: 0.50    Types: Cigarettes    Quit date: 01/14/1984    Years since quitting: 37.0  Substance Use Topics   Alcohol use: Yes    Comment: occ. social   Drug use: No    ALLERGIES:  Allergies  Allergen Reactions   Clarithromycin Nausea And Vomiting    MEDICATIONS:  Current Outpatient Medications  Medication Sig Dispense Refill   ALPRAZolam (XANAX) 0.5 MG tablet take 1 tablet by mouth twice a day if needed FOR ANXIETY  0   amLODipine-benazepril (LOTREL) 5-10 MG per capsule Take 1 capsule by mouth every morning.     aspirin EC 81 MG tablet Take 81 mg by mouth daily.     atorvastatin (LIPITOR) 40 MG tablet Take 40 mg by mouth at bedtime.     Calcium Carbonate-Vitamin D (CALCIUM + D PO) Take 1 tablet by mouth daily.     chlorthalidone (HYGROTON) 25 MG tablet Take 25 mg by mouth every morning.     Cholecalciferol 25 MCG (1000 UT) tablet Take by mouth.     gabapentin (NEURONTIN) 300 MG capsule Take 300 mg by mouth daily.     glucosamine-chondroitin 500-400 MG tablet Take 1 tablet by mouth daily.     Magnesium 250 MG TABS Take 1 tablet by mouth daily.     Multiple Vitamin (MULTIVITAMIN WITH MINERALS) TABS tablet Take 1 tablet by mouth daily.     nystatin cream (MYCOSTATIN) Apply 1 application topically 2 (two) times daily as  needed.     omeprazole (PRILOSEC OTC) 20 MG tablet Take 20 mg by mouth daily.     potassium chloride (K-DUR,KLOR-CON) 10 MEQ tablet Take 10 mEq by mouth 2 (two) times daily.     sertraline (ZOLOFT) 50 MG tablet Take 50 mg by mouth every morning.     TART CHERRY PO Take 1 tablet by mouth daily as needed.     tolterodine (DETROL LA) 4 MG 24 hr capsule Take 4 mg by mouth daily.     No current facility-administered medications for this encounter.    REVIEW OF SYSTEMS:  A 10+ POINT REVIEW OF SYSTEMS WAS OBTAINED including neurology, dermatology, psychiatry, cardiac, respiratory, lymph, extremities, GI, GU, musculoskeletal, constitutional, reproductive, HEENT.  She denies any pain within the pelvis area vaginal discharge or bleeding.  She denies any problems with abdominal bloating.  She denies any rectal bleeding or hematuria.  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  She reports overall tolerating the chemotherapy well without   any significant problems such as diarrhea constipation or nausea.   PHYSICAL EXAM:  weight is 321 lb 4.8 oz (145.7 kg) (abnormal). Her temperature is 97.3 F (36.3 C) (abnormal). Her blood pressure is 130/58 (abnormal) and her pulse is 124 (abnormal). Her respiration is 18 and oxygen saturation is 99%.  Body mass index is 53.47 kg/m.  General: Alert and oriented, in no acute distress HEENT: Head is normocephalic. Extraocular movements are intact.  Neck: Neck is supple, no palpable cervical or supraclavicular lymphadenopathy. Heart: Regular in rate and rhythm with no murmurs, rubs, or gallops. Chest: Clear to auscultation bilaterally, with no rhonchi, wheezes, or rales. Abdomen: Soft, nontender, nondistended, with no rigidity or guarding.  Small scars present along the abdominal region from her laparoscopic procedure Extremities: No cyanosis or edema. Lymphatics: see Neck Exam Skin: No concerning lesions. Musculoskeletal: symmetric strength and  muscle tone throughout. Neurologic: Cranial nerves II through XII are grossly intact. No obvious focalities. Speech is fluent. Coordination is intact. Psychiatric: Judgment and insight are intact. Affect is appropriate. On pelvic examination the external genitalia are unremarkable.  A speculum exam is performed.  There are no mucosal lesions noted in the vaginal vault.  On bimanual examination no pelvic masses are appreciated.  Vaginal cuff intact.  ECOG = 1  0 - Asymptomatic (Fully active, able to carry on all predisease activities without restriction)  1 - Symptomatic but completely ambulatory (Restricted in physically strenuous activity but ambulatory and able to carry out work of a light or sedentary nature. For example, light housework, office work)  2 - Symptomatic, <50% in bed during the day (Ambulatory and capable of all self care but unable to carry out any work activities. Up and about more than 50% of waking hours)  3 - Symptomatic, >50% in bed, but not bedbound (Capable of only limited self-care, confined to bed or chair 50% or more of waking hours)  4 - Bedbound (Completely disabled. Cannot carry on any self-care. Totally confined to bed or chair)  5 - Death   Eustace Pen MM, Creech RH, Tormey DC, et al. [phone]). "Toxicity and response criteria of the [facility]". [place] Oncol. 5 (6): 649-55  LABORATORY DATA:  Lab Results  Component Value Date   WBC 9.4 03/15/2011   HGB 6.9 (LL) 03/15/2011   HCT 20.1 (L) 03/15/2011   MCV 91.0 03/15/2011   PLT 206 03/15/2011   NEUTROABS 7.6 03/03/2011   Lab Results  Component Value Date   NA 138 03/13/2011   K 3.3 (L) 03/13/2011   CL 100 03/13/2011   CO2 32 03/13/2011   GLUCOSE 140 (H) 03/13/2011   CREATININE <0.47 (L) 03/13/2011   CALCIUM 8.5 03/13/2011      RADIOGRAPHY: No results found.    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 21-day  paclitaxel and carboplatin  The patient has tolerated her surgery and adjuvant chemotherapy well.  Due to comorbidities she is unable to have surgical staging of the pelvis however prior to initiation of chemotherapy and after completion of chemotherapy there was no suspicious areas within the chest abdomen or pelvis to suggest metastatic disease.  We discussed the role of radiation therapy for stage IIIa high-grade endometrial cancer.  We discussed that this may provide benefit in terms of reducing recurrences within the pelvis region.  We discussed the general course of radiation therapy anticipated side effects and potential toxicities/major complications of postoperative radiation therapy  for advanced endometrial cancer.  Discussed with the patient that radiation therapy would extend over approximately 5 weeks on a daily basis.  We would use intensity modulated radiation therapy to cover the areas needed with her radiation f but also to limit radiation to critical structures such as the small bowel and bone marrow.  Given the patient's deeply invasive tumor and grade 3 would also recommend consideration for vaginal cuff radiation therapy at the completion of her external beam treatments.    The patient was encouraged to ask questions that I answered to the best of my ability.  After careful evaluation, she would like to proceed with adjuvant radiation therapy as above.  A patient consent form was discussed and signed.  We retained a copy for our records.  The patient would like to proceed with radiation and will be scheduled for CT simulation.  PLAN: Patient will proceed with CT simulation next week with begin treatments beginning approximately 10 days from now.  Anticipate 5 weeks of external beam radiation therapy followed by 3 intracavitary brachytherapy treatments directed at the vaginal cuff.   60 minutes of total time was spent for this patient encounter, including preparation, face-to-face  counseling with the patient and coordination of care, physical exam, and documentation of the encounter.   ------------------------------------------------  James D. Kinard, PhD, MD  This document serves as a record of services personally performed by James Kinard, MD. It was created on his behalf by Elisa Frazier, a trained medical scribe. The creation of this record is based on the scribe's personal observations and the provider's statements to them. This document has been checked and approved by the attending provider.   

## 2021-01-14 NOTE — Progress Notes (Signed)
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  Weight changes, if any: no  Bowel/Bladder complaints, if any: Yes.  ,  urinary frequency, mild incontinence, nocturia x 1-2  Nausea/Vomiting, if any: no  Pain issues, if any:  no  SAFETY ISSUES: Prior radiation? no Pacemaker/ICD? no Possible current pregnancy? no, hysterectomy Is the patient on methotrexate? no  Current Complaints / other details:  muscle cramps   Vitals:   01/15/21 1311  BP: (!) 130/58  Pulse: (!) 124  Resp: 18  Temp: (!) 97.3 F (36.3 C)  SpO2: 99%  Weight: (!) 321 lb 4.8 oz (145.7 kg)

## 2021-01-15 ENCOUNTER — Ambulatory Visit
Admission: RE | Admit: 2021-01-15 | Discharge: 2021-01-15 | Disposition: A | Discharge status: Home / Self Care | Payer: Federal, State, Local not specified - PPO | Source: Ambulatory Visit | Attending: Radiation Oncology | Admitting: Radiation Oncology

## 2021-01-15 ENCOUNTER — Other Ambulatory Visit: Payer: Self-pay

## 2021-01-15 ENCOUNTER — Encounter: Payer: Self-pay | Admitting: Radiation Oncology

## 2021-01-15 VITALS — BP 130/80 | HR 124 | Temp 97.3°F | Resp 18 | Wt 321.3 lb

## 2021-01-15 DIAGNOSIS — Z79899 Other long term (current) drug therapy: Secondary | ICD-10-CM | POA: Insufficient documentation

## 2021-01-15 DIAGNOSIS — M75101 Unspecified rotator cuff tear or rupture of right shoulder, not specified as traumatic: Secondary | ICD-10-CM | POA: Diagnosis not present

## 2021-01-15 DIAGNOSIS — M1990 Unspecified osteoarthritis, unspecified site: Secondary | ICD-10-CM | POA: Insufficient documentation

## 2021-01-15 DIAGNOSIS — Z7982 Long term (current) use of aspirin: Secondary | ICD-10-CM | POA: Diagnosis not present

## 2021-01-15 DIAGNOSIS — G629 Polyneuropathy, unspecified: Secondary | ICD-10-CM | POA: Diagnosis not present

## 2021-01-15 DIAGNOSIS — K219 Gastro-esophageal reflux disease without esophagitis: Secondary | ICD-10-CM | POA: Insufficient documentation

## 2021-01-15 DIAGNOSIS — I10 Essential (primary) hypertension: Secondary | ICD-10-CM | POA: Insufficient documentation

## 2021-01-15 DIAGNOSIS — C541 Malignant neoplasm of endometrium: Secondary | ICD-10-CM | POA: Insufficient documentation

## 2021-01-15 NOTE — Progress Notes (Signed)
See MD note for nursing evaluation. °

## 2021-01-18 ENCOUNTER — Encounter: Payer: Self-pay | Admitting: Radiation Oncology

## 2021-01-19 ENCOUNTER — Other Ambulatory Visit: Payer: Self-pay

## 2021-01-19 ENCOUNTER — Ambulatory Visit
Admission: RE | Admit: 2021-01-19 | Discharge: 2021-01-19 | Disposition: A | Discharge status: Home / Self Care | Payer: Federal, State, Local not specified - PPO | Source: Ambulatory Visit | Attending: Radiation Oncology | Admitting: Radiation Oncology

## 2021-01-19 DIAGNOSIS — C541 Malignant neoplasm of endometrium: Secondary | ICD-10-CM | POA: Diagnosis present

## 2021-01-19 NOTE — Telephone Encounter (Signed)
Dr Sondra Come spoke to patient in the treatment area with recommendations.

## 2021-01-20 ENCOUNTER — Encounter: Payer: Self-pay | Admitting: Licensed Clinical Social Worker

## 2021-01-20 NOTE — Progress Notes (Signed)
[place] Psychosocial Distress Screening Clinical Social Work  Clinical Social Work was referred by distress screening protocol.  The patient scored a 10 on the Psychosocial Distress Thermometer which indicates severe distress. Clinical Social Worker contacted patient by phone to assess for distress and other psychosocial needs.   Patient reports doing "fine" right now and having good support from people in her life. CSW and patient discussed the importance of support during treatment.  CSW informed patient of the support team and support services at [facility].  Pt denies any resource or support needs at this time.  CSW provided contact information and encouraged patient to call with any questions or concerns.  ONCBCN DISTRESS SCREENING 01/15/2021  Screening Type Initial Screening  Distress experienced in past week (1-10) 10  Emotional problem type Nervousness/Anxiety  Information Concerns Type Lack of info about treatment  Physical Problem type Sleep/insomnia;Tingling hands/feet  Referral to clinical social work Yes    Clinical Social Worker follow up needed: No. Pt wants to call as needed  If yes, follow up plan:  Chetan Mehring E Tyreonna Czaplicki, LCSW

## 2021-01-22 NOTE — Addendum Note (Signed)
Encounter addended by: Ranell Patrick, RN on: 01/22/2021 1:10 PM  Actions taken: Vitals modified

## 2021-01-30 DIAGNOSIS — C541 Malignant neoplasm of endometrium: Secondary | ICD-10-CM | POA: Diagnosis not present

## 2021-02-02 ENCOUNTER — Other Ambulatory Visit: Payer: Self-pay

## 2021-02-02 ENCOUNTER — Ambulatory Visit
Admission: RE | Admit: 2021-02-02 | Discharge: 2021-02-02 | Disposition: A | Discharge status: Home / Self Care | Payer: Federal, State, Local not specified - PPO | Source: Ambulatory Visit | Attending: Radiation Oncology | Admitting: Radiation Oncology

## 2021-02-02 DIAGNOSIS — C541 Malignant neoplasm of endometrium: Secondary | ICD-10-CM | POA: Insufficient documentation

## 2021-02-02 DIAGNOSIS — Z510 Encounter for antineoplastic radiation therapy: Secondary | ICD-10-CM | POA: Insufficient documentation

## 2021-02-03 ENCOUNTER — Ambulatory Visit
Admission: RE | Admit: 2021-02-03 | Discharge: 2021-02-03 | Disposition: A | Discharge status: Home / Self Care | Payer: Federal, State, Local not specified - PPO | Source: Ambulatory Visit | Attending: Radiation Oncology | Admitting: Radiation Oncology

## 2021-02-03 DIAGNOSIS — C541 Malignant neoplasm of endometrium: Secondary | ICD-10-CM | POA: Diagnosis not present

## 2021-02-04 ENCOUNTER — Other Ambulatory Visit: Payer: Self-pay

## 2021-02-04 ENCOUNTER — Ambulatory Visit
Admission: RE | Admit: 2021-02-04 | Discharge: 2021-02-04 | Disposition: A | Discharge status: Home / Self Care | Payer: Federal, State, Local not specified - PPO | Source: Ambulatory Visit | Attending: Radiation Oncology | Admitting: Radiation Oncology

## 2021-02-04 DIAGNOSIS — C541 Malignant neoplasm of endometrium: Secondary | ICD-10-CM | POA: Diagnosis not present

## 2021-02-05 ENCOUNTER — Ambulatory Visit
Admission: RE | Admit: 2021-02-05 | Discharge: 2021-02-05 | Disposition: A | Discharge status: Home / Self Care | Payer: Federal, State, Local not specified - PPO | Source: Ambulatory Visit | Attending: Radiation Oncology | Admitting: Radiation Oncology

## 2021-02-05 DIAGNOSIS — C541 Malignant neoplasm of endometrium: Secondary | ICD-10-CM | POA: Diagnosis not present

## 2021-02-06 ENCOUNTER — Ambulatory Visit
Admission: RE | Admit: 2021-02-06 | Discharge: 2021-02-06 | Disposition: A | Discharge status: Home / Self Care | Payer: Federal, State, Local not specified - PPO | Source: Ambulatory Visit | Attending: Radiation Oncology | Admitting: Radiation Oncology

## 2021-02-06 ENCOUNTER — Other Ambulatory Visit: Payer: Self-pay

## 2021-02-06 DIAGNOSIS — C541 Malignant neoplasm of endometrium: Secondary | ICD-10-CM | POA: Diagnosis not present

## 2021-02-09 ENCOUNTER — Other Ambulatory Visit: Payer: Self-pay

## 2021-02-09 ENCOUNTER — Ambulatory Visit
Admission: RE | Admit: 2021-02-09 | Discharge: 2021-02-09 | Disposition: A | Discharge status: Home / Self Care | Payer: Federal, State, Local not specified - PPO | Source: Ambulatory Visit | Attending: Radiation Oncology | Admitting: Radiation Oncology

## 2021-02-09 DIAGNOSIS — C541 Malignant neoplasm of endometrium: Secondary | ICD-10-CM | POA: Diagnosis not present

## 2021-02-10 ENCOUNTER — Other Ambulatory Visit: Payer: Self-pay

## 2021-02-10 ENCOUNTER — Ambulatory Visit
Admission: RE | Admit: 2021-02-10 | Discharge: 2021-02-10 | Disposition: A | Discharge status: Home / Self Care | Payer: Federal, State, Local not specified - PPO | Source: Ambulatory Visit | Attending: Radiation Oncology | Admitting: Radiation Oncology

## 2021-02-10 DIAGNOSIS — C541 Malignant neoplasm of endometrium: Secondary | ICD-10-CM | POA: Diagnosis not present

## 2021-02-11 ENCOUNTER — Ambulatory Visit
Admission: RE | Admit: 2021-02-11 | Discharge: 2021-02-11 | Disposition: A | Discharge status: Home / Self Care | Payer: Federal, State, Local not specified - PPO | Source: Ambulatory Visit | Attending: Radiation Oncology | Admitting: Radiation Oncology

## 2021-02-11 DIAGNOSIS — C541 Malignant neoplasm of endometrium: Secondary | ICD-10-CM | POA: Diagnosis not present

## 2021-02-12 ENCOUNTER — Ambulatory Visit
Admission: RE | Admit: 2021-02-12 | Discharge: 2021-02-12 | Disposition: A | Discharge status: Home / Self Care | Payer: Federal, State, Local not specified - PPO | Source: Ambulatory Visit | Attending: Radiation Oncology | Admitting: Radiation Oncology

## 2021-02-12 ENCOUNTER — Other Ambulatory Visit: Payer: Self-pay

## 2021-02-12 DIAGNOSIS — C541 Malignant neoplasm of endometrium: Secondary | ICD-10-CM | POA: Diagnosis not present

## 2021-02-13 ENCOUNTER — Ambulatory Visit
Admission: RE | Admit: 2021-02-13 | Discharge: 2021-02-13 | Disposition: A | Discharge status: Home / Self Care | Payer: Federal, State, Local not specified - PPO | Source: Ambulatory Visit | Attending: Radiation Oncology | Admitting: Radiation Oncology

## 2021-02-13 DIAGNOSIS — C541 Malignant neoplasm of endometrium: Secondary | ICD-10-CM | POA: Diagnosis not present

## 2021-02-16 ENCOUNTER — Ambulatory Visit
Admission: RE | Admit: 2021-02-16 | Discharge: 2021-02-16 | Disposition: A | Discharge status: Home / Self Care | Payer: Federal, State, Local not specified - PPO | Source: Ambulatory Visit | Attending: Radiation Oncology | Admitting: Radiation Oncology

## 2021-02-16 ENCOUNTER — Other Ambulatory Visit: Payer: Self-pay

## 2021-02-16 DIAGNOSIS — C541 Malignant neoplasm of endometrium: Secondary | ICD-10-CM | POA: Diagnosis not present

## 2021-02-17 ENCOUNTER — Ambulatory Visit
Admission: RE | Admit: 2021-02-17 | Discharge: 2021-02-17 | Disposition: A | Discharge status: Home / Self Care | Payer: Federal, State, Local not specified - PPO | Source: Ambulatory Visit | Attending: Radiation Oncology | Admitting: Radiation Oncology

## 2021-02-17 DIAGNOSIS — C541 Malignant neoplasm of endometrium: Secondary | ICD-10-CM | POA: Diagnosis not present

## 2021-02-18 ENCOUNTER — Ambulatory Visit
Admission: RE | Admit: 2021-02-18 | Discharge: 2021-02-18 | Disposition: A | Discharge status: Home / Self Care | Payer: Federal, State, Local not specified - PPO | Source: Ambulatory Visit | Attending: Radiation Oncology | Admitting: Radiation Oncology

## 2021-02-18 ENCOUNTER — Other Ambulatory Visit: Payer: Self-pay

## 2021-02-18 DIAGNOSIS — C541 Malignant neoplasm of endometrium: Secondary | ICD-10-CM | POA: Diagnosis not present

## 2021-02-19 ENCOUNTER — Ambulatory Visit
Admission: RE | Admit: 2021-02-19 | Discharge: 2021-02-19 | Disposition: A | Discharge status: Home / Self Care | Payer: Federal, State, Local not specified - PPO | Source: Ambulatory Visit | Attending: Radiation Oncology | Admitting: Radiation Oncology

## 2021-02-19 DIAGNOSIS — C541 Malignant neoplasm of endometrium: Secondary | ICD-10-CM | POA: Diagnosis not present

## 2021-02-20 ENCOUNTER — Other Ambulatory Visit: Payer: Self-pay

## 2021-02-20 ENCOUNTER — Ambulatory Visit
Admission: RE | Admit: 2021-02-20 | Discharge: 2021-02-20 | Disposition: A | Discharge status: Home / Self Care | Payer: Federal, State, Local not specified - PPO | Source: Ambulatory Visit | Attending: Radiation Oncology | Admitting: Radiation Oncology

## 2021-02-20 DIAGNOSIS — C541 Malignant neoplasm of endometrium: Secondary | ICD-10-CM | POA: Diagnosis not present

## 2021-02-23 ENCOUNTER — Ambulatory Visit
Admission: RE | Admit: 2021-02-23 | Discharge: 2021-02-23 | Disposition: A | Discharge status: Home / Self Care | Payer: Federal, State, Local not specified - PPO | Source: Ambulatory Visit | Attending: Radiation Oncology | Admitting: Radiation Oncology

## 2021-02-23 ENCOUNTER — Other Ambulatory Visit: Payer: Self-pay

## 2021-02-23 DIAGNOSIS — C541 Malignant neoplasm of endometrium: Secondary | ICD-10-CM | POA: Diagnosis not present

## 2021-02-24 ENCOUNTER — Ambulatory Visit
Admission: RE | Admit: 2021-02-24 | Discharge: 2021-02-24 | Disposition: A | Discharge status: Home / Self Care | Payer: Federal, State, Local not specified - PPO | Source: Ambulatory Visit | Attending: Radiation Oncology | Admitting: Radiation Oncology

## 2021-02-24 DIAGNOSIS — C541 Malignant neoplasm of endometrium: Secondary | ICD-10-CM | POA: Diagnosis not present

## 2021-02-25 ENCOUNTER — Other Ambulatory Visit: Payer: Self-pay

## 2021-02-25 ENCOUNTER — Ambulatory Visit
Admission: RE | Admit: 2021-02-25 | Discharge: 2021-02-25 | Disposition: A | Discharge status: Home / Self Care | Payer: Federal, State, Local not specified - PPO | Source: Ambulatory Visit | Attending: Radiation Oncology | Admitting: Radiation Oncology

## 2021-02-25 DIAGNOSIS — C541 Malignant neoplasm of endometrium: Secondary | ICD-10-CM | POA: Diagnosis not present

## 2021-02-26 ENCOUNTER — Ambulatory Visit
Admission: RE | Admit: 2021-02-26 | Discharge: 2021-02-26 | Disposition: A | Discharge status: Home / Self Care | Payer: Federal, State, Local not specified - PPO | Source: Ambulatory Visit | Attending: Radiation Oncology | Admitting: Radiation Oncology

## 2021-02-26 DIAGNOSIS — C541 Malignant neoplasm of endometrium: Secondary | ICD-10-CM | POA: Diagnosis not present

## 2021-02-27 ENCOUNTER — Other Ambulatory Visit: Payer: Self-pay

## 2021-02-27 ENCOUNTER — Ambulatory Visit
Admission: RE | Admit: 2021-02-27 | Discharge: 2021-02-27 | Disposition: A | Discharge status: Home / Self Care | Payer: Federal, State, Local not specified - PPO | Source: Ambulatory Visit | Attending: Radiation Oncology | Admitting: Radiation Oncology

## 2021-02-27 DIAGNOSIS — C541 Malignant neoplasm of endometrium: Secondary | ICD-10-CM | POA: Diagnosis not present

## 2021-03-02 ENCOUNTER — Ambulatory Visit
Admission: RE | Admit: 2021-03-02 | Discharge: 2021-03-02 | Disposition: A | Discharge status: Home / Self Care | Payer: Federal, State, Local not specified - PPO | Source: Ambulatory Visit | Attending: Radiation Oncology | Admitting: Radiation Oncology

## 2021-03-02 ENCOUNTER — Other Ambulatory Visit: Payer: Self-pay

## 2021-03-02 DIAGNOSIS — C541 Malignant neoplasm of endometrium: Secondary | ICD-10-CM | POA: Diagnosis not present

## 2021-03-03 ENCOUNTER — Ambulatory Visit
Admission: RE | Admit: 2021-03-03 | Discharge: 2021-03-03 | Disposition: A | Discharge status: Home / Self Care | Payer: Federal, State, Local not specified - PPO | Source: Ambulatory Visit | Attending: Radiation Oncology | Admitting: Radiation Oncology

## 2021-03-03 ENCOUNTER — Other Ambulatory Visit: Payer: Self-pay

## 2021-03-03 DIAGNOSIS — C541 Malignant neoplasm of endometrium: Secondary | ICD-10-CM | POA: Diagnosis not present

## 2021-03-04 ENCOUNTER — Other Ambulatory Visit: Payer: Self-pay

## 2021-03-04 ENCOUNTER — Ambulatory Visit
Admission: RE | Admit: 2021-03-04 | Discharge: 2021-03-04 | Disposition: A | Discharge status: Home / Self Care | Payer: Federal, State, Local not specified - PPO | Source: Ambulatory Visit | Attending: Radiation Oncology | Admitting: Radiation Oncology

## 2021-03-04 DIAGNOSIS — C541 Malignant neoplasm of endometrium: Secondary | ICD-10-CM | POA: Diagnosis not present

## 2021-03-05 ENCOUNTER — Other Ambulatory Visit: Payer: Self-pay

## 2021-03-05 ENCOUNTER — Ambulatory Visit
Admission: RE | Admit: 2021-03-05 | Discharge: 2021-03-05 | Disposition: A | Discharge status: Home / Self Care | Payer: Federal, State, Local not specified - PPO | Source: Ambulatory Visit | Attending: Radiation Oncology | Admitting: Radiation Oncology

## 2021-03-05 DIAGNOSIS — C541 Malignant neoplasm of endometrium: Secondary | ICD-10-CM | POA: Insufficient documentation

## 2021-03-05 DIAGNOSIS — Z510 Encounter for antineoplastic radiation therapy: Secondary | ICD-10-CM | POA: Insufficient documentation

## 2021-03-06 ENCOUNTER — Ambulatory Visit
Admission: RE | Admit: 2021-03-06 | Discharge: 2021-03-06 | Disposition: A | Discharge status: Home / Self Care | Payer: Federal, State, Local not specified - PPO | Source: Ambulatory Visit | Attending: Radiation Oncology | Admitting: Radiation Oncology

## 2021-03-06 ENCOUNTER — Other Ambulatory Visit: Payer: Self-pay

## 2021-03-06 DIAGNOSIS — C541 Malignant neoplasm of endometrium: Secondary | ICD-10-CM | POA: Diagnosis not present

## 2021-03-10 ENCOUNTER — Ambulatory Visit
Admission: RE | Admit: 2021-03-10 | Discharge: 2021-03-10 | Disposition: A | Discharge status: Home / Self Care | Payer: Federal, State, Local not specified - PPO | Source: Ambulatory Visit | Attending: Radiation Oncology | Admitting: Radiation Oncology

## 2021-03-10 ENCOUNTER — Other Ambulatory Visit: Payer: Self-pay

## 2021-03-10 DIAGNOSIS — C541 Malignant neoplasm of endometrium: Secondary | ICD-10-CM | POA: Diagnosis not present

## 2021-03-11 ENCOUNTER — Ambulatory Visit
Admission: RE | Admit: 2021-03-11 | Discharge: 2021-03-11 | Disposition: A | Discharge status: Home / Self Care | Payer: Federal, State, Local not specified - PPO | Source: Ambulatory Visit | Attending: Radiation Oncology | Admitting: Radiation Oncology

## 2021-03-11 ENCOUNTER — Encounter: Payer: Self-pay | Admitting: Radiation Oncology

## 2021-03-11 DIAGNOSIS — C541 Malignant neoplasm of endometrium: Secondary | ICD-10-CM | POA: Diagnosis not present

## 2021-03-12 ENCOUNTER — Encounter: Payer: Self-pay | Admitting: Radiation Oncology

## 2021-03-12 ENCOUNTER — Ambulatory Visit
Admission: RE | Admit: 2021-03-12 | Discharge: 2021-03-12 | Disposition: A | Discharge status: Home / Self Care | Payer: Federal, State, Local not specified - PPO | Source: Ambulatory Visit | Attending: Radiation Oncology | Admitting: Radiation Oncology

## 2021-03-12 ENCOUNTER — Other Ambulatory Visit: Payer: Self-pay

## 2021-03-12 DIAGNOSIS — C541 Malignant neoplasm of endometrium: Secondary | ICD-10-CM | POA: Diagnosis not present

## 2021-03-12 NOTE — Progress Notes (Signed)
Maurice March, MD Reason for referral-tachycardia  HPI: 65 year old female for evaluation of tachycardia at request of Glendon Axe, MD.  Laboratories July 2022 showed BUN 11, creatinine 0.62, potassium 4.4, hemoglobin 10.1.  Also of note patient has stage IIIa endometrial carcinoma; recently completed therapy including chemotherapy and radiation.  Recently noted to be tachycardic and cardiology asked to evaluate.  Patient has dyspnea on exertion that she attributes to her weight.  She denies orthopnea, PND, pedal edema, palpitations, syncope or chest pain.  Current Outpatient Medications  Medication Sig Dispense Refill   ALPRAZolam (XANAX) 0.5 MG tablet take 1 tablet by mouth twice a day if needed FOR ANXIETY  0   amLODipine-benazepril (LOTREL) 5-10 MG per capsule Take 1 capsule by mouth every morning.     aspirin EC 81 MG tablet Take 81 mg by mouth daily.     atorvastatin (LIPITOR) 40 MG tablet Take 40 mg by mouth at bedtime.     Calcium Carbonate-Vitamin D (CALCIUM + D PO) Take 1 tablet by mouth daily.     chlorthalidone (HYGROTON) 25 MG tablet Take 25 mg by mouth every morning.     Cholecalciferol 25 MCG (1000 UT) tablet Take by mouth.     Coenzyme Q10 (CO Q-10) 200 MG CAPS Take 200 mg by mouth as directed.     gabapentin (NEURONTIN) 300 MG capsule Take 300 mg by mouth daily.     glucosamine-chondroitin 500-400 MG tablet Take 1 tablet by mouth daily.     Multiple Vitamin (MULTIVITAMIN WITH MINERALS) TABS tablet Take 1 tablet by mouth daily.     nystatin cream (MYCOSTATIN) Apply 1 application topically 2 (two) times daily as needed.     omeprazole (PRILOSEC OTC) 20 MG tablet Take 20 mg by mouth daily.     potassium chloride (K-DUR,KLOR-CON) 10 MEQ tablet Take 10 mEq by mouth 2 (two) times daily.     PRESCRIPTION MEDICATION M6 PROTEIN PLUS     sertraline (ZOLOFT) 50 MG tablet Take 50 mg by mouth every morning.     TART CHERRY PO Take 1 tablet by mouth daily as needed.      tolterodine (DETROL LA) 4 MG 24 hr capsule Take 4 mg by mouth daily.     Magnesium 250 MG TABS Take 1 tablet by mouth daily.     No current facility-administered medications for this visit.    Allergies  Allergen Reactions   Clarithromycin Nausea And Vomiting     Past Medical History:  Diagnosis Date   Anxiety    Arthritis    osteoarthritis   Endometrial cancer (HCC)    GERD (gastroesophageal reflux disease)    occ. OTC meds used   H/O irritable bowel syndrome    past history after gallbladder   Hypertension     Past Surgical History:  Procedure Laterality Date   CHALAZION EXCISION     CHOLECYSTECTOMY     laparoscopic   COLONOSCOPY WITH PROPOFOL N/A 01/21/2015   Procedure: COLONOSCOPY WITH PROPOFOL;  Surgeon: Juanita Craver, MD;  Location: [facility];  Service: Endoscopy;  Laterality: N/A;   ESOPHAGOGASTRODUODENOSCOPY ENDOSCOPY     HYSTERECTOMY ABDOMINAL WITH SALPINGECTOMY     JOINT REPLACEMENT     [id number]   TONSILLECTOMY     child    Social History   Socioeconomic History   Marital status: Widowed    Spouse name: Not on file   Number of children: Not on file   Years of education: Not on file  Highest education level: Not on file  Occupational History   Not on file  Tobacco Use   Smoking status: Former    Packs/day: 0.25    Years: 2.00    Pack years: 0.50    Types: Cigarettes    Quit date: 01/14/1984    Years since quitting: 37.2   Smokeless tobacco: Not on file  Substance and Sexual Activity   Alcohol use: Not Currently    Comment: occ. social   Drug use: No   Sexual activity: Not on file  Other Topics Concern   Not on file  Social History Narrative   Not on file   Social Determinants of Health   Financial Resource Strain: Not on file  Food Insecurity: Not on file  Transportation Needs: Not on file  Physical Activity: Not on file  Stress: Not on file  Social Connections: Not on file  Intimate Partner Violence: Not on file    Family  History  Problem Relation Age of Onset   Hypertension Mother     ROS: no fevers or chills, productive cough, hemoptysis, dysphasia, odynophagia, melena, hematochezia, dysuria, hematuria, rash, seizure activity, orthopnea, PND, pedal edema, claudication. Remaining systems are negative.  Physical Exam:   Blood pressure 132/80, pulse (!) 102, height '5\' 5"'$  (1.651 m), weight (!) 328 lb (148.8 kg), SpO2 98 %.  General:  Well developed/obese in NAD Skin warm/dry Patient not depressed No peripheral clubbing Back-normal HEENT-normal/normal eyelids Neck supple/normal carotid upstroke bilaterally; no bruits; no JVD; no thyromegaly chest - CTA/ normal expansion CV - RRR/normal S1 and S2; no murmurs, rubs or gallops;  PMI nondisplaced Abdomen -NT/ND, no HSM, no mass, + bowel sounds, no bruit 2+ femoral pulses, no bruits Ext-no edema, chords, 2+ DP Neuro-grossly nonfocal  ECG -electrocardiogram Nov 25, 2020 showed sinus tachycardia and left axis deviation.  Personally reviewed  Today's electrocardiogram-sinus tachycardia at a rate of 102, cannot rule out inferior infarct.  Personally reviewed.  A/P  1 tachycardia-etiology unclear.  Will check hemoglobin and TSH.  Schedule echocardiogram to assess LV function.  She denies decreased p.o. intake.  If above normal would not pursue further cardiac evaluation.  She otherwise has no symptoms.  2 hypertension-blood pressure controlled.  Continue present medications and follow.  3 endometrial cancer-status post hysterectomy, chemotherapy and radiation.  Follow-up oncology.  Kirk Ruths, MD

## 2021-03-18 ENCOUNTER — Ambulatory Visit: Payer: Federal, State, Local not specified - PPO | Admitting: Cardiology

## 2021-03-18 ENCOUNTER — Encounter: Payer: Self-pay | Admitting: Cardiology

## 2021-03-18 ENCOUNTER — Other Ambulatory Visit: Payer: Self-pay

## 2021-03-18 VITALS — BP 132/80 | HR 102 | Ht 65.0 in | Wt 328.0 lb

## 2021-03-18 DIAGNOSIS — I479 Paroxysmal tachycardia, unspecified: Secondary | ICD-10-CM

## 2021-03-18 DIAGNOSIS — I10 Essential (primary) hypertension: Secondary | ICD-10-CM | POA: Diagnosis not present

## 2021-03-18 NOTE — Patient Instructions (Signed)
Medication Instructions:   Your physician recommends that you continue on your current medications as directed. Please refer to the Current Medication list given to you today.  *If you need a refill on your cardiac medications before your next appointment, please call your pharmacy*   Lab Work: TODAY!!!!!  CBC/TSH If you have labs (blood work) drawn today and your tests are completely normal, you will receive your results only by: [place] (if you have MyChart) OR A paper copy in the mail If you have any lab test that is abnormal or we need to change your treatment, we will call you to review the results.   Testing/Procedures: Your physician has requested that you have an echocardiogram. Echocardiography is a painless test that uses sound waves to create images of your heart. It provides your doctor with information about the size and shape of your heart and how well your heart's chambers and valves are working. This procedure takes approximately one hour. There are no restrictions for this procedure.   Follow-Up: At [facility], you and your health needs are our priority.  As part of our continuing mission to provide you with exceptional heart care, we have created designated Provider Care Teams.  These Care Teams include your primary Cardiologist (physician) and Advanced Practice Providers (APPs -  Physician Assistants and Nurse Practitioners) who all work together to provide you with the care you need, when you need it.  We recommend signing up for the patient portal called "MyChart".  Sign up information is provided on this After Visit Summary.  MyChart is used to connect with patients for Virtual Visits (Telemedicine).  Patients are able to view lab/test results, encounter notes, upcoming appointments, etc.  Non-urgent messages can be sent to your provider as well.   To learn more about what you can do with MyChart, go to [email or web address].    Your next appointment:     As needed  The format for your next appointment:     Provider:      Other Instructions

## 2021-03-19 LAB — CBC
Hematocrit: 35.1 % (ref 34.0–46.6)
Hemoglobin: 11.8 g/dL (ref 11.1–15.9)
MCH: 31.6 pg (ref 26.6–33.0)
MCHC: 33.6 g/dL (ref 31.5–35.7)
MCV: 94 fL (ref 79–97)
Platelets: 205 10*3/uL (ref 150–450)
RBC: 3.73 x10E6/uL — ABNORMAL LOW (ref 3.77–5.28)
RDW: 14.1 % (ref 11.7–15.4)
WBC: 4.2 10*3/uL (ref 3.4–10.8)

## 2021-03-19 LAB — TSH: TSH: 1.13 u[IU]/mL (ref 0.450–4.500)

## 2021-03-30 ENCOUNTER — Encounter: Payer: Self-pay | Admitting: *Deleted

## 2021-04-01 ENCOUNTER — Ambulatory Visit (HOSPITAL_BASED_OUTPATIENT_CLINIC_OR_DEPARTMENT_OTHER): Admission: RE | Admit: 2021-04-01 | Payer: Federal, State, Local not specified - PPO | Source: Ambulatory Visit

## 2021-04-06 ENCOUNTER — Encounter: Payer: Self-pay | Admitting: Radiation Oncology

## 2021-04-11 NOTE — Progress Notes (Incomplete)
  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 She reports headaches at the back of her head which were present prior to radiation treatment. She stated that she stopped gabapentin thinking it was the cause but they haven't improved. Reports taking acetaminophen twice daily which relieves headache and denies any other pain. Patient also reports moderate fatigue that is manageable, continued urinary frequency, and new, very mild dysuria. She denies diarrhea, constipation, vaginal discharge/bleeding, skin changes, or irritation.   On physical exam, skin remains intact and abdomen is soft nontender with normal bowel sounds.  Plan: The patient will follow-up with radiation oncology in one month.  ________________________________________________ -----------------------------------  Blair Promise, PhD, MD  This document serves as a record of services personally performed by Gery Pray, MD. It was created on his behalf by Roney Mans, a trained medical scribe. The creation of this record is based on the scribe's personal observations and the provider's statements to them. This document has been checked and approved by the attending provider.

## 2021-04-11 NOTE — Progress Notes (Signed)
Radiation Oncology         [phone] ________________________________  Name: Kimberly CULLIFER MRN: [id number]  Date: 04/13/2021  DOB: 1956-06-26  Follow-Up Visit Note  CC: Glendon Axe, MD  Hart Rochester, MD    ICD-10-CM   1. 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 She tolerated her recent radiation treatment relatively well with the exception of, manageable moderate fatigue, urinary frequency, and very mild dysuria. The patient also reported an ongoing headache during treatment, however, this was present prior to the start of treatment.    The patient has not had any additional imaging  since she was seen for consultation on 01/15/21.    She did see Dr. Polly Cobia last week and pelvic exam revealed  no evidence of tumor.  Patient also reported her tumor marker had decreased further.     She continues to have some mild fatigue but denies any problems with nausea, diarrhea, pelvic pain or vaginal bleeding.  She denies any problems with abdominal bloating or appetite problems.              Allergies:  is allergic to clarithromycin.  Meds: Current Outpatient Medications  Medication Sig Dispense Refill   ALPRAZolam (XANAX) 0.5 MG tablet take 1 tablet by mouth twice a day if needed FOR ANXIETY  0   amLODipine-benazepril (LOTREL) 5-10 MG per capsule Take 1 capsule by mouth every morning.     aspirin EC 81 MG tablet Take 81 mg by mouth daily.     atorvastatin (LIPITOR) 40 MG tablet Take 40 mg by mouth at bedtime.     Calcium Carbonate-Vitamin D (CALCIUM + D PO) Take  1 tablet by mouth daily.     chlorthalidone (HYGROTON) 25 MG tablet Take 25 mg by mouth every morning.     Cholecalciferol 25 MCG (1000 UT) tablet Take by mouth.     Coenzyme Q10 (CO Q-10) 200 MG CAPS Take 200 mg by mouth as directed.     gabapentin (NEURONTIN) 300 MG capsule Take 300 mg by mouth daily.     glucosamine-chondroitin 500-400 MG tablet Take 1 tablet by mouth daily.     Magnesium 500 MG CAPS Take 500 mg by mouth daily.     Multiple Vitamin (MULTIVITAMIN WITH MINERALS) TABS tablet Take 1 tablet by mouth daily.     nystatin cream (MYCOSTATIN) Apply 1 application topically 2 (two) times daily as needed.     omeprazole (PRILOSEC OTC) 20 MG tablet Take 20 mg by mouth daily.     potassium chloride (K-DUR,KLOR-CON) 10 MEQ tablet Take 10 mEq by mouth 2 (two) times daily.     sertraline (ZOLOFT) 50 MG tablet Take 50 mg by mouth every morning.     TART CHERRY PO Take 1 tablet by mouth daily as needed.     tolterodine (DETROL LA) 4 MG 24 hr capsule Take 4 mg by mouth daily.     Magnesium 250 MG TABS Take 1 tablet by mouth daily.     No current facility-administered medications for this encounter.  Physical Findings: The patient is in no acute distress. Patient is alert and oriented.  height is 5\' 5"  (1.651 m) and weight is 331 lb (150.1 kg) (abnormal). Her temporal temperature is 97.6 F (36.4 C). Her blood pressure is 133/90 and her pulse is 117 (abnormal). Her respiration is 20 and oxygen saturation is 97%. .  No significant changes. Lungs are clear to auscultation bilaterally. Heart has regular rate and rhythm. No palpable cervical, supraclavicular, or axillary adenopathy. Abdomen soft, non-tender, normal bowel sounds.  Pelvic exam deferred in light of recent exam by Dr. Polly Cobia last week.      Lab Findings: Lab Results  Component Value Date   WBC 4.2 03/18/2021   HGB 11.8 03/18/2021   HCT 35.1 03/18/2021   MCV 94 03/18/2021   PLT 205 03/18/2021    Radiographic  Findings: No results found.  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  Patient reports mild fatigue but otherwise has fully recovered from her radiation treatment. She received 43 Gray to the pelvis followed by a central pelvic boost.  She did not wish to have vaginal brachytherapy as her boost.  Plan: The patient will be following up with Dr. Polly Cobia every 3 months and in light of this close follow-up have not scheduled the patient for formal follow-up but would be glad to see her at any time.  Today the patient was given a vaginal dilator and instructions on its use.   ____________________________________  Blair Promise, PhD, MD   This document serves as a record of services personally performed by Gery Pray, MD. It was created on his behalf by Roney Mans, a trained medical scribe. The creation of this record is based on the scribe's personal observations and the provider's statements to them. This document has been checked and approved by the attending provider.

## 2021-04-13 ENCOUNTER — Other Ambulatory Visit: Payer: Self-pay

## 2021-04-13 ENCOUNTER — Ambulatory Visit
Admission: RE | Admit: 2021-04-13 | Discharge: 2021-04-13 | Disposition: A | Discharge status: Home / Self Care | Payer: Federal, State, Local not specified - PPO | Source: Ambulatory Visit | Attending: Radiation Oncology | Admitting: Radiation Oncology

## 2021-04-13 ENCOUNTER — Encounter: Payer: Self-pay | Admitting: Radiation Oncology

## 2021-04-13 DIAGNOSIS — R5383 Other fatigue: Secondary | ICD-10-CM | POA: Insufficient documentation

## 2021-04-13 DIAGNOSIS — Z7982 Long term (current) use of aspirin: Secondary | ICD-10-CM | POA: Diagnosis not present

## 2021-04-13 DIAGNOSIS — Z923 Personal history of irradiation: Secondary | ICD-10-CM | POA: Diagnosis not present

## 2021-04-13 DIAGNOSIS — Z79899 Other long term (current) drug therapy: Secondary | ICD-10-CM | POA: Diagnosis not present

## 2021-04-13 DIAGNOSIS — C541 Malignant neoplasm of endometrium: Secondary | ICD-10-CM | POA: Diagnosis not present

## 2021-04-13 DIAGNOSIS — R300 Dysuria: Secondary | ICD-10-CM | POA: Diagnosis not present

## 2021-04-13 HISTORY — DX: Personal history of irradiation: Z92.3

## 2021-04-13 NOTE — Progress Notes (Addendum)
[name] is here today for follow up post radiation to the pelvic.  They completed their radiation on: 03/12/21  Does the patient complain of any of the following:  Pain:Patient denies pain.  Abdominal bloating: no Diarrhea/Constipation: no Nausea/Vomiting: no Vaginal Discharge: no Blood in Urine or Stool: no Urinary Issues (dysuria/incomplete emptying/ incontinence/ increased frequency/urgency): no Does patient report using vaginal dilator 2-3 times a week and/or sexually active 2-3 weeks: Patient given S+ and xs+ vaginal dilators with instructions. Educated patient on use and importance of use. Patient voiced understanding.  Post radiation skin changes: no   Additional comments if applicable:   Vitals:   04/13/21 1445  BP: 133/90  Pulse: (!) 117  Resp: 20  Temp: 97.6 F (36.4 C)  TempSrc: Temporal  SpO2: 97%  Weight: (!) 331 lb (150.1 kg)  Height: 5\' 5"  (1.651 m)

## 2021-04-15 ENCOUNTER — Other Ambulatory Visit: Payer: Self-pay

## 2021-04-15 ENCOUNTER — Ambulatory Visit (HOSPITAL_BASED_OUTPATIENT_CLINIC_OR_DEPARTMENT_OTHER)
Admission: RE | Admit: 2021-04-15 | Discharge: 2021-04-15 | Disposition: A | Discharge status: Home / Self Care | Payer: Federal, State, Local not specified - PPO | Source: Ambulatory Visit | Attending: Cardiology | Admitting: Cardiology

## 2021-04-15 ENCOUNTER — Other Ambulatory Visit: Payer: Self-pay | Admitting: Gastroenterology

## 2021-04-15 DIAGNOSIS — I479 Paroxysmal tachycardia, unspecified: Secondary | ICD-10-CM | POA: Insufficient documentation

## 2021-04-15 LAB — ECHOCARDIOGRAM COMPLETE
AR max vel: 2.76 cm2
AV Area VTI: 2.97 cm2
AV Area mean vel: 2.85 cm2
AV Mean grad: 4 mmHg
AV Peak grad: 7.1 mmHg
Ao pk vel: 1.33 m/s
Area-P 1/2: 4.12 cm2
S' Lateral: 2.1 cm

## 2021-04-15 MED ORDER — PERFLUTREN LIPID MICROSPHERE
1.0000 mL | INTRAVENOUS | Status: AC | PRN
  Administered 2021-04-15: 2 mL via INTRAVENOUS

## 2021-06-02 ENCOUNTER — Encounter (HOSPITAL_COMMUNITY): Payer: Self-pay | Admitting: Gastroenterology

## 2021-06-10 ENCOUNTER — Encounter (HOSPITAL_COMMUNITY): Payer: Self-pay | Admitting: Anesthesiology

## 2021-06-12 ENCOUNTER — Ambulatory Visit (HOSPITAL_COMMUNITY)
Admission: RE | Admit: 2021-06-12 | Payer: Federal, State, Local not specified - PPO | Source: Ambulatory Visit | Admitting: Gastroenterology

## 2021-06-12 SURGERY — COLONOSCOPY WITH PROPOFOL
Anesthesia: Monitor Anesthesia Care

## 2022-01-25 ENCOUNTER — Ambulatory Visit: Payer: Federal, State, Local not specified - PPO | Admitting: Podiatry

## 2022-02-10 ENCOUNTER — Encounter (INDEPENDENT_AMBULATORY_CARE_PROVIDER_SITE_OTHER): Payer: Self-pay

## 2023-04-07 ENCOUNTER — Encounter: Payer: Self-pay | Admitting: Nurse Practitioner

## 2023-04-07 ENCOUNTER — Ambulatory Visit: Payer: Medicare Other | Attending: Nurse Practitioner | Admitting: Nurse Practitioner

## 2023-04-07 VITALS — BP 118/66 | HR 116 | Ht 65.0 in | Wt 232.0 lb

## 2023-04-07 DIAGNOSIS — I10 Essential (primary) hypertension: Secondary | ICD-10-CM | POA: Insufficient documentation

## 2023-04-07 DIAGNOSIS — I479 Paroxysmal tachycardia, unspecified: Secondary | ICD-10-CM | POA: Insufficient documentation

## 2023-04-07 DIAGNOSIS — C541 Malignant neoplasm of endometrium: Secondary | ICD-10-CM | POA: Diagnosis present

## 2023-04-07 DIAGNOSIS — Z87898 Personal history of other specified conditions: Secondary | ICD-10-CM | POA: Diagnosis present

## 2023-04-07 NOTE — Progress Notes (Signed)
Office Visit    Patient Name: [name] Date of Encounter: 04/07/2023  Primary Care Provider:  Caffie Damme, MD Primary Cardiologist:  Olga Millers, MD  Chief Complaint    67 year old female with a history of tachycardia, hypertension, endometrial cancer, arthritis, GERD, and anxiety who presents for follow-up related to tachycardia.  Past Medical History    Past Medical History:  Diagnosis Date   Anxiety    Arthritis    osteoarthritis   Endometrial cancer (HCC)    GERD (gastroesophageal reflux disease)    occ. OTC meds used   H/O irritable bowel syndrome    past history after gallbladder   History of radiation therapy    endometrium- 02/02/21-03/12/21- Dr. Antony Blackbird   Hypertension    Past Surgical History:  Procedure Laterality Date   CHALAZION EXCISION     CHOLECYSTECTOMY     laparoscopic   COLONOSCOPY WITH PROPOFOL N/A 01/21/2015   Procedure: COLONOSCOPY WITH PROPOFOL;  Surgeon: Charna Elizabeth, MD;  Location: [facility];  Service: Endoscopy;  Laterality: N/A;   ESOPHAGOGASTRODUODENOSCOPY ENDOSCOPY     HYSTERECTOMY ABDOMINAL WITH SALPINGECTOMY     JOINT REPLACEMENT     [id number]   TONSILLECTOMY     child    Allergies  Allergies  Allergen Reactions   Clarithromycin Nausea And Vomiting    Pt says she does not have a reaction to this     Labs/Other Studies Reviewed    The following studies were reviewed today:  Cardiac Studies & Procedures       ECHOCARDIOGRAM  ECHOCARDIOGRAM COMPLETE 04/15/2021  Narrative ECHOCARDIOGRAM REPORT    Patient Name:   [name] Date of Exam: 04/15/2021 Medical Rec #:  [id number]     Height:       65.0 in Accession #:    [id number]    Weight:       331.0 lb Date of Birth:  Feb 16, 1956     BSA:          2.450 m Patient Age:    67 years      BP:           135/95 mmHg Patient Gender: F             HR:           104 bpm. Exam Location:  [place]  Procedure: 2D Echo, Cardiac Doppler, Color Doppler and  Intracardiac Opacification Agent  Indications:    R00.0 Tachycardia  History:        Patient has no prior history of Echocardiogram examinations. Arrythmias:Tachycardia, Signs/Symptoms:Dyspnea and Shortness of Breath; Risk Factors:Former Smoker.  Sonographer:    San Jetty RDCS, RVT Referring Phys: [id number] BRIAN S CRENSHAW   Sonographer Comments: Patient is morbidly obese. IMPRESSIONS   1. Left ventricular ejection fraction, by estimation, is 65 to 70%. The left ventricle has normal function. The left ventricle has no regional wall motion abnormalities. There is mild concentric left ventricular hypertrophy. Left ventricular diastolic parameters are consistent with Grade I diastolic dysfunction (impaired relaxation). 2. Right ventricular systolic function is normal. The right ventricular size is normal. 3. The mitral valve is normal in structure. No evidence of mitral valve regurgitation. No evidence of mitral stenosis. 4. The aortic valve is tricuspid. Aortic valve regurgitation is not visualized. No aortic stenosis is present. 5. There is mild dilatation of the ascending aorta, measuring 38 mm.  FINDINGS Left Ventricle: Left ventricular ejection fraction, by estimation, is 65 to 70%. The left  ventricle has normal function. The left ventricle has no regional wall motion abnormalities. Definity contrast agent was given IV to delineate the left ventricular endocardial borders. The left ventricular internal cavity size was normal in size. There is mild concentric left ventricular hypertrophy. Left ventricular diastolic parameters are consistent with Grade I diastolic dysfunction (impaired relaxation). Normal left ventricular filling pressure.  Right Ventricle: The right ventricular size is normal. No increase in right ventricular wall thickness. Right ventricular systolic function is normal.  Left Atrium: Left atrial size was normal in size.  Right Atrium: Right atrial size was normal  in size.  Pericardium: There is no evidence of pericardial effusion.  Mitral Valve: The mitral valve is normal in structure. No evidence of mitral valve regurgitation. No evidence of mitral valve stenosis.  Tricuspid Valve: The tricuspid valve is normal in structure. Tricuspid valve regurgitation is trivial. No evidence of tricuspid stenosis.  Aortic Valve: The aortic valve is tricuspid. Aortic valve regurgitation is not visualized. No aortic stenosis is present. Aortic valve mean gradient measures 4.0 mmHg. Aortic valve peak gradient measures 7.1 mmHg. Aortic valve area, by VTI measures 2.97 cm.  Pulmonic Valve: The pulmonic valve was not well visualized.  Aorta: The aortic root is normal in size and structure. There is mild dilatation of the ascending aorta, measuring 38 mm.  Venous: The pulmonary veins were not well visualized. The inferior vena cava was not well visualized.  IAS/Shunts: No atrial level shunt detected by color flow Doppler.   LEFT VENTRICLE PLAX 2D LVIDd:         3.70 cm   Diastology LVIDs:         2.10 cm   LV e' medial:    7.72 cm/s LV PW:         1.30 cm   LV E/e' medial:  7.2 LV IVS:        1.20 cm   LV e' lateral:   12.40 cm/s LVOT diam:     2.00 cm   LV E/e' lateral: 4.5 LV SV:         65 LV SV Index:   27 LVOT Area:     3.14 cm   RIGHT VENTRICLE RV S prime:     11.00 cm/s TAPSE (M-mode): 1.8 cm  LEFT ATRIUM         Index LA diam:    3.00 cm 1.22 cm/m AORTIC VALVE                    PULMONIC VALVE AV Area (Vmax):    2.76 cm     PV Vmax:       1.07 m/s AV Area (Vmean):   2.85 cm     PV Peak grad:  4.6 mmHg AV Area (VTI):     2.97 cm AV Vmax:           133.00 cm/s AV Vmean:          89.400 cm/s AV VTI:            0.220 m AV Peak Grad:      7.1 mmHg AV Mean Grad:      4.0 mmHg LVOT Vmax:         117.00 cm/s LVOT Vmean:        81.000 cm/s LVOT VTI:          0.208 m LVOT/AV VTI ratio: 0.95  AORTA Ao Root diam: 3.30 cm Ao Asc diam:  3.80  cm  MITRAL VALVE               TRICUSPID VALVE MV Area (PHT): 4.12 cm    TR Peak grad:   18.7 mmHg MV Decel Time: 184 msec    TR Vmax:        216.00 cm/s MV E velocity: 55.40 cm/s MV A velocity: 95.40 cm/s  SHUNTS MV E/A ratio:  0.58        Systemic VTI:  0.21 m Systemic Diam: 2.00 cm  Norman Herrlich MD Electronically signed by Norman Herrlich MD Signature Date/Time: 04/15/2021/5:54:36 PM    Final            Recent Labs: No results found for requested labs within last 365 days.  Recent Lipid Panel No results found for: "CHOL", "TRIG", "HDL", "CHOLHDL", "VLDL", "LDLCALC", "LDLDIRECT"  History of Present Illness    67 year old female with the above past medical history including tachycardia, hypertension, endometrial cancer, arthritis, GERD, and anxiety.  She was referred to cardiology in 2022 in the setting of tachycardia.  She was last seen in the office on 03/18/2021 and was stable from a cardiac standpoint.  She noted some mild dyspnea on exertion which she attributed to weight gain.  Echocardiogram showed EF 65 to 70%, normal LV function, no RWMA, G1 DD, normal RV, no significant valvular abnormalities.  She presents today for follow-up.  Since her last visit she has been stable from a cardiac standpoint.  She is undergoing chemotherapy for lung metastases following treatment for uterine cancer. She had a recent episode of pre-syncope and syncope that occurred the week after her chemotherapy. She reported low BP at the time.  She denies any palpitations, chest pain, dyspnea, edema, PND, orthopnea, weight gain.  She has not taken her blood pressure medication since.  She denies any further dizziness, presyncope or syncope.  BP has been stable.    Home Medications    Current Outpatient Medications  Medication Sig Dispense Refill   nystatin cream (MYCOSTATIN) Apply 1 application topically 2 (two) times daily as needed for dry skin.     OXYCODONE HCL PO Take 1 tablet by mouth as  needed. STARTED OXYCODONE DURING CHEMO SIDE EFFECTS FOR DAYS     ALPRAZolam (XANAX) 0.5 MG tablet Take 0.5 mg by mouth at bedtime as needed for sleep.  0   amLODipine-benazepril (LOTREL) 5-10 MG per capsule Take 1 capsule by mouth every morning. (Patient not taking: Reported on 04/07/2023)     aspirin EC 81 MG tablet Take 81 mg by mouth daily.     atorvastatin (LIPITOR) 40 MG tablet Take 40 mg by mouth at bedtime.     Calcium Carbonate-Vitamin D (CALCIUM + D PO) Take 1 tablet by mouth daily.     Cholecalciferol 25 MCG (1000 UT) tablet Take 1,000 Units by mouth daily.     Coenzyme Q10 (CO Q-10) 200 MG CAPS Take 200 mg by mouth daily.     gabapentin (NEURONTIN) 300 MG capsule Take 300 mg by mouth 2 (two) times daily.     glucosamine-chondroitin 500-400 MG tablet Take 1 tablet by mouth daily.     Magnesium 500 MG CAPS Take 500 mg by mouth daily.     Multiple Vitamin (MULTIVITAMIN WITH MINERALS) TABS tablet Take 1 tablet by mouth daily.     omeprazole (PRILOSEC OTC) 20 MG tablet Take 20 mg by mouth daily.     potassium chloride (K-DUR,KLOR-CON) 10 MEQ tablet Take 20 mEq by mouth 2 (two) times  daily.     sertraline (ZOLOFT) 50 MG tablet Take 50 mg by mouth every morning.     TART CHERRY PO Take 1 tablet by mouth daily as needed (gout).     tolterodine (DETROL LA) 4 MG 24 hr capsule Take 4 mg by mouth daily.     No current facility-administered medications for this visit.     Review of Systems    She denies chest pain, palpitations, dyspnea, pnd, orthopnea, n, v, dizziness, edema, weight gain, or early satiety. All other systems reviewed and are otherwise negative except as noted above.   Physical Exam    VS:  BP 118/66 (BP Location: Left Arm, Patient Position: Sitting, Cuff Size: Large)   Pulse (!) 116   Ht 5\' 5"  (1.651 m)   Wt 232 lb (105.2 kg)   BMI 38.61 kg/m  GEN: Well nourished, well developed, in no acute distress. HEENT: normal. Neck: Supple, no JVD, carotid bruits, or  masses. Cardiac: RRR, no murmurs, rubs, or gallops. No clubbing, cyanosis, edema.  Radials/DP/PT 2+ and equal bilaterally.  Respiratory:  Respirations regular and unlabored, clear to auscultation bilaterally. GI: Soft, nontender, nondistended, BS + x 4. MS: no deformity or atrophy. Skin: warm and dry, no rash. Neuro:  Strength and sensation are intact. Psych: Normal affect.  Accessory Clinical Findings    ECG personally reviewed by me today - EKG Interpretation Date/Time:  Thursday April 07 2023 13:53:22 EDT Ventricular Rate:  116 PR Interval:  174 QRS Duration:  74 QT Interval:  316 QTC Calculation: 439 R Axis:   -2  Text Interpretation: Sinus tachycardia Nonspecific ST and T wave abnormality Confirmed by Bernadene Person ([id number]) on 04/07/2023 6:44:21 PM  - no acute changes.   Lab Results  Component Value Date   WBC 4.2 03/18/2021   HGB 11.8 03/18/2021   HCT 35.1 03/18/2021   MCV 94 03/18/2021   PLT 205 03/18/2021   Lab Results  Component Value Date   CREATININE <0.47 (L) 03/13/2011   BUN 7 03/13/2011   NA 138 03/13/2011   K 3.3 (L) 03/13/2011   CL 100 03/13/2011   CO2 32 03/13/2011   No results found for: "ALT", "AST", "GGT", "ALKPHOS", "BILITOT" No results found for: "CHOL", "HDL", "LDLCALC", "LDLDIRECT", "TRIG", "CHOLHDL"  No results found for: "HGBA1C"  Assessment & Plan    1. Tachycardia/syncope: Echocardiogram in 04/2021 showed EF 65 to 70%, normal LV function, no RWMA, G1 DD, normal RV, no significant valvular abnormalities.  EKG today shows sinus tachycardia.  She appears to be asymptomatic with this as she did note a recent episode of syncope that occurred the week after chemotherapy.  Her BP was low at the time.  She stopped taking her blood pressure medication.  She denies any further dizziness, presyncope or syncope.  We discussed possible repeat echo, recommendation for cardiac monitor, however, she declines at this time.  Continue to monitor HR, continue to  monitor symptoms.  Reviewed ED precautions.  2. Hypertension: Recent hypotension and syncope, which likely occurred in the setting of low BP.  She stopped taking her blood pressure medication.  BP has been stable ever since. She denies any further dizziness, presyncope or syncope.  Will have her remain off antihypertensives for now.  Continue to monitor BP, symptoms.  3. History of endometrial cancer: Now with metastases to her lungs.  Currently undergoing chemotherapy.  Following with oncology.   4. Disposition: Follow-up in 3 to 4 months.  Joylene Grapes, NP 04/07/2023, 6:49 PM

## 2023-04-07 NOTE — Patient Instructions (Addendum)
Medication Instructions:  Your physician recommends that you continue on your current medications as directed. Please refer to the Current Medication list given to you today.  *If you need a refill on your cardiac medications before your next appointment, please call your pharmacy*   Lab Work: NONE ordered at this time of appointment     Testing/Procedures: NONE ordered at this time of appointment     Follow-Up: At [facility], you and your health needs are our priority.  As part of our continuing mission to provide you with exceptional heart care, we have created designated Provider Care Teams.  These Care Teams include your primary Cardiologist (physician) and Advanced Practice Providers (APPs -  Physician Assistants and Nurse Practitioners) who all work together to provide you with the care you need, when you need it.  We recommend signing up for the patient portal called "MyChart".  Sign up information is provided on this After Visit Summary.  MyChart is used to connect with patients for Virtual Visits (Telemedicine).  Patients are able to view lab/test results, encounter notes, upcoming appointments, etc.  Non-urgent messages can be sent to your provider as well.   To learn more about what you can do with MyChart, go to [email or web address].    Your next appointment:   3-4 month(s)  Provider:   Bernadene Person, NP

## 2023-07-12 ENCOUNTER — Ambulatory Visit: Payer: Federal, State, Local not specified - PPO | Admitting: Nurse Practitioner

## 2024-07-02 ENCOUNTER — Emergency Department (HOSPITAL_BASED_OUTPATIENT_CLINIC_OR_DEPARTMENT_OTHER)
Admission: EM | Admit: 2024-07-02 | Discharge: 2024-07-02 | Disposition: A | Discharge status: Home / Self Care | Attending: Emergency Medicine | Admitting: Emergency Medicine

## 2024-07-02 ENCOUNTER — Other Ambulatory Visit: Payer: Self-pay

## 2024-07-02 ENCOUNTER — Encounter (HOSPITAL_BASED_OUTPATIENT_CLINIC_OR_DEPARTMENT_OTHER): Payer: Self-pay

## 2024-07-02 DIAGNOSIS — J101 Influenza due to other identified influenza virus with other respiratory manifestations: Secondary | ICD-10-CM | POA: Insufficient documentation

## 2024-07-02 DIAGNOSIS — R059 Cough, unspecified: Secondary | ICD-10-CM | POA: Diagnosis present

## 2024-07-02 DIAGNOSIS — Z7982 Long term (current) use of aspirin: Secondary | ICD-10-CM | POA: Insufficient documentation

## 2024-07-02 LAB — BASIC METABOLIC PANEL WITH GFR
Anion gap: 12 (ref 5–15)
BUN: 12 mg/dL (ref 8–23)
CO2: 29 mmol/L (ref 22–32)
Calcium: 9.5 mg/dL (ref 8.9–10.3)
Chloride: 99 mmol/L (ref 98–111)
Creatinine, Ser: 0.59 mg/dL (ref 0.44–1.00)
GFR, Estimated: >60 mL/min
Glucose, Bld: 97 mg/dL (ref 70–99)
Potassium: 4.7 mmol/L (ref 3.5–5.1)
Sodium: 139 mmol/L (ref 135–145)

## 2024-07-02 LAB — CBC WITH DIFFERENTIAL/PLATELET
Abs Immature Granulocytes: 0.05 K/uL (ref 0.00–0.07)
Basophils Absolute: 0 K/uL (ref 0.0–0.1)
Basophils Relative: 0 %
Eosinophils Absolute: 0 K/uL (ref 0.0–0.5)
Eosinophils Relative: 1 %
HCT: 39.9 % (ref 36.0–46.0)
Hemoglobin: 13.2 g/dL (ref 12.0–15.0)
Immature Granulocytes: 1 %
Lymphocytes Relative: 2 %
Lymphs Abs: 0.1 K/uL — ABNORMAL LOW (ref 0.7–4.0)
MCH: 32.4 pg (ref 26.0–34.0)
MCHC: 33.1 g/dL (ref 30.0–36.0)
MCV: 98 fL (ref 80.0–100.0)
Monocytes Absolute: 0.6 K/uL (ref 0.1–1.0)
Monocytes Relative: 13 %
Neutro Abs: 4.1 K/uL (ref 1.7–7.7)
Neutrophils Relative %: 83 %
Platelets: 175 K/uL (ref 150–400)
RBC: 4.07 MIL/uL (ref 3.87–5.11)
RDW: 13 % (ref 11.5–15.5)
WBC: 4.9 K/uL (ref 4.0–10.5)
nRBC: 0 % (ref 0.0–0.2)

## 2024-07-02 LAB — RESP PANEL BY RT-PCR (RSV, FLU A&B, COVID)  RVPGX2
Influenza A by PCR: POSITIVE — AB
Influenza B by PCR: NEGATIVE
Resp Syncytial Virus by PCR: NEGATIVE
SARS Coronavirus 2 by RT PCR: NEGATIVE

## 2024-07-02 LAB — GROUP A STREP BY PCR: Group A Strep by PCR: NOT DETECTED

## 2024-07-02 MED ORDER — OSELTAMIVIR PHOSPHATE 75 MG PO CAPS: 75.0000 mg | ORAL_CAPSULE | Freq: Two times a day (BID) | ORAL | 0 refills | Status: AC

## 2024-07-02 MED ORDER — ACETAMINOPHEN 325 MG PO TABS
650.0000 mg | ORAL_TABLET | Freq: Once | ORAL | Status: AC | PRN
  Administered 2024-07-02: 650 mg via ORAL
  Filled 2024-07-02: qty 2

## 2024-07-02 NOTE — ED Triage Notes (Signed)
 Reports sore throat, congestion, headache, cough, fatigue for 4 days  Also reports rash on abdomen for 2 weeks.   States family is sick w similar symptoms

## 2024-07-02 NOTE — Discharge Instructions (Addendum)
 It was a pleasure to care of you today.  As we discussed your influenza test was positive.  We are starting on Tamiflu .  You may fill the cough medicine prescribed to you by the telehealth visit  Make sure to keep a close eye on your symptom  Use the nystatin medication at home  Return for new or worsening symptoms

## 2024-07-02 NOTE — ED Provider Notes (Signed)
 " [place] [facility] Provider Note   CSN: [id number] Arrival date & time: 07/02/24  1448    Patient presents with: Sore Throat and Rash   [name] is a 68 y.o. female here for evaluation of congestion, cough, fatigue, scratchy throat over the last 4 days.  Exposed to sick family members during Christmas.  She has known metastatic cancer on Keytruda.  Seen via telehealth visit yesterday had cough medicine prescribed.  No chest pain, shortness of breath, back pain, ABD pain, nausea or vomiting.  She has noted a rash underneath bilateral breast over the last 2 to 3 weeks, intermittently using nystatin and steroids.  Nontender.  Intermittently pruritic.  Has had something previously.  She is suppoded to start chemotherapy on Thursday.  No hemoptysis, lower extremity swelling, calf pain     On immunotherapy   HPI     Prior to Admission medications  Medication Sig Start Date End Date Taking? Authorizing Provider  oseltamivir  (TAMIFLU ) 75 MG capsule Take 1 capsule (75 mg total) by mouth every 12 (twelve) hours. 07/02/24  Yes Simcha Farrington A, PA-C  ALPRAZolam (XANAX) 0.5 MG tablet Take 0.5 mg by mouth at bedtime as needed for sleep. 01/10/15   [provider]  amLODipine-benazepril (LOTREL) 5-10 MG per capsule Take 1 capsule by mouth every morning. Patient not taking: Reported on 04/07/2023    [provider]  aspirin EC 81 MG tablet Take 81 mg by mouth daily.    [provider]  atorvastatin (LIPITOR) 40 MG tablet Take 40 mg by mouth at bedtime. 10/25/20   [provider]  Calcium Carbonate-Vitamin D (CALCIUM + D PO) Take 1 tablet by mouth daily.    [provider]  Cholecalciferol 25 MCG (1000 UT) tablet Take 1,000 Units by mouth daily.    [provider]  Coenzyme Q10 (CO Q-10) 200 MG CAPS Take 200 mg by mouth daily.    [provider]  gabapentin (NEURONTIN) 300 MG capsule Take 300 mg  by mouth 2 (two) times daily. 12/04/20   [provider]  glucosamine-chondroitin 500-400 MG tablet Take 1 tablet by mouth daily.    [provider]  Magnesium 500 MG CAPS Take 500 mg by mouth daily.    [provider]  Multiple Vitamin (MULTIVITAMIN WITH MINERALS) TABS tablet Take 1 tablet by mouth daily.    [provider]  nystatin cream (MYCOSTATIN) Apply 1 application topically 2 (two) times daily as needed for dry skin. 11/05/20   [provider]  omeprazole (PRILOSEC OTC) 20 MG tablet Take 20 mg by mouth daily.    [provider]  OXYCODONE HCL PO Take 1 tablet by mouth as needed. STARTED OXYCODONE DURING CHEMO SIDE EFFECTS FOR DAYS    [provider]  potassium chloride (K-DUR,KLOR-CON) 10 MEQ tablet Take 20 mEq by mouth 2 (two) times daily.    [provider]  sertraline (ZOLOFT) 50 MG tablet Take 50 mg by mouth every morning.    [provider]  TART CHERRY PO Take 1 tablet by mouth daily as needed (gout).    [provider]  tolterodine (DETROL LA) 4 MG 24 hr capsule Take 4 mg by mouth daily.    [provider]    Allergies: Clarithromycin    Review of Systems  Constitutional:  Positive for fatigue and fever.  HENT:  Positive for congestion, postnasal drip, rhinorrhea, sinus pressure and sore throat. Negative for  trouble swallowing and voice change.   Respiratory:  Positive for cough. Negative for shortness of breath, wheezing and stridor.   Cardiovascular: Negative.   Gastrointestinal: Negative.   Genitourinary: Negative.   Musculoskeletal: Negative.   Skin:  Positive for rash.  Neurological: Negative.   All other systems reviewed and are negative.   Updated Vital Signs BP (!) 168/109 (BP Location: Right Arm)   Pulse (!) 109   Temp (!) 100.4 F (38 C)   Resp 20   SpO2 97%   Physical Exam Vitals and nursing note reviewed.  Constitutional:      General: She is not in acute  distress.    Appearance: She is well-developed. She is not ill-appearing, toxic-appearing or diaphoretic.  HENT:     Head: Normocephalic and atraumatic.     Nose: Congestion and rhinorrhea present.     Mouth/Throat:     Mouth: Mucous membranes are moist.     Tonsils: No tonsillar exudate or tonsillar abscesses. 0 on the right. 0 on the left.     Comments: PO clear, uvula midline.  Sublingual area soft Eyes:     Pupils: Pupils are equal, round, and reactive to light.  Cardiovascular:     Rate and Rhythm: Normal rate.     Heart sounds: Normal heart sounds.  Pulmonary:     Effort: Pulmonary effort is normal. No respiratory distress.     Breath sounds: Normal breath sounds.     Comments: Clear bilaterally, speaks in full sentences without difficulty Abdominal:     General: Bowel sounds are normal. There is no distension.     Palpations: Abdomen is soft.     Tenderness: There is no abdominal tenderness. There is no rebound.  Musculoskeletal:        General: Normal range of motion.     Cervical back: Normal range of motion.  Skin:    General: Skin is warm and dry.     Capillary Refill: Capillary refill takes less than 2 seconds.     Findings: Rash present.     Comments: Erythematous rash bilateral breast folds.  No fluctuance, induration, vesicles, bulla, target lesions, desquamated skin.  Neurological:     General: No focal deficit present.     Mental Status: She is alert.  Psychiatric:        Mood and Affect: Mood normal.     (all labs ordered are listed, but only abnormal results are displayed) Labs Reviewed  RESP PANEL BY RT-PCR (RSV, FLU A&B, COVID)  RVPGX2 - Abnormal; Notable for the following components:      Result Value   Influenza A by PCR POSITIVE (*)    All other components within normal limits  CBC WITH DIFFERENTIAL/PLATELET - Abnormal; Notable for the following components:   Lymphs Abs 0.1 (*)    All other components within normal limits  GROUP A STREP BY PCR   BASIC METABOLIC PANEL WITH GFR    EKG: None  Radiology: No results found.   Procedures   Medications Ordered in the ED  acetaminophen  (TYLENOL ) tablet 650 mg (650 mg Oral Given 07/02/24 5877)   68 year old multiple medical comorbidities here for evaluation of URI symptoms.  Multiple family members during Christmas.  Had 4 days of cough, congestion, rhinorrhea, sore throat.  Given symptomatic management at telehealth urgent care yesterday evening.  No chest pain, shortness of breath, hemoptysis, no pain or swelling to lower legs.  No abdominal pain.  Has known metastatic cancer, on immunotherapy,  Keytruda.  She is supposed to start chemotherapy on Thursday.  She does have erythematous rash underneath bilateral breast folds consistent with likely yeast.  No vesicles, symptoms to suggest bacterial infectious process.  No rash to palms or soles.  Low suspicion for SJS, TEN.  Viral panel obtained from triage.  Given cancer history will get basic labs to ensure no neutropenia  Labs personally viewed interpreted:  CBC without leukocytosis, neutrophils 83 BMP wo acute abnormality   Strep not detected Influenza A positive  Discussed results with patient.  Will start on Tamiflu  after discussing risk versus benefit.  Will have her follow-up closely with her PCP, oncology team.  Low suspicion for sepsis, neutropenic fever, PTA, RPA, meningitis, fluid overload, PE.  Will patient follow-up outpatient, return for any worsening symptoms  The patient has been appropriately medically screened and/or stabilized in the ED. I have low suspicion for any other emergent medical condition which would require further screening, evaluation or treatment in the ED or require inpatient management.  Patient is hemodynamically stable and in no acute distress.  Patient able to ambulate in department prior to ED.  Evaluation does not show acute pathology that would require ongoing or additional emergent interventions  while in the emergency department or further inpatient treatment.  I have discussed the diagnosis with the patient and answered all questions.  Pain is been managed while in the emergency department and patient has no further complaints prior to discharge.  Patient is comfortable with plan discussed in room and is stable for discharge at this time.  I have discussed strict return precautions for returning to the emergency department.  Patient was encouraged to follow-up with PCP/specialist refer to at discharge.                                    Medical Decision Making Amount and/or Complexity of Data Reviewed External Data Reviewed: labs, radiology and notes. Labs: ordered. Decision-making details documented in ED Course.  Risk OTC drugs. Prescription drug management. Diagnosis or treatment significantly limited by social determinants of health.        Final diagnoses:  Influenza A    ED Discharge Orders          Ordered    oseltamivir  (TAMIFLU ) 75 MG capsule  Every 12 hours        07/02/24 1727               Buna Cuppett A, PA-C 07/02/24 1851  "
